# Patient Record
Sex: Female | Born: 1970 | Race: White | Hispanic: No | Marital: Married | Smoking: Never smoker
Health system: Southern US, Community
[De-identification: ages and names within clinical notes are randomized; demographics above are authoritative.]

## PROBLEM LIST (undated history)

## (undated) DIAGNOSIS — Z9289 Personal history of other medical treatment: Secondary | ICD-10-CM

## (undated) DIAGNOSIS — Z803 Family history of malignant neoplasm of breast: Secondary | ICD-10-CM

## (undated) DIAGNOSIS — G43909 Migraine, unspecified, not intractable, without status migrainosus: Secondary | ICD-10-CM

## (undated) DIAGNOSIS — R112 Nausea with vomiting, unspecified: Secondary | ICD-10-CM

## (undated) DIAGNOSIS — T753XXA Motion sickness, initial encounter: Secondary | ICD-10-CM

## (undated) DIAGNOSIS — R42 Dizziness and giddiness: Secondary | ICD-10-CM

## (undated) DIAGNOSIS — Z9889 Other specified postprocedural states: Secondary | ICD-10-CM

## (undated) DIAGNOSIS — D219 Benign neoplasm of connective and other soft tissue, unspecified: Secondary | ICD-10-CM

## (undated) HISTORY — DX: Migraine, unspecified, not intractable, without status migrainosus: G43.909

## (undated) HISTORY — PX: WISDOM TOOTH EXTRACTION: SHX21

## (undated) HISTORY — PX: BREAST BIOPSY: SHX20

## (undated) HISTORY — DX: Benign neoplasm of connective and other soft tissue, unspecified: D21.9

## (undated) HISTORY — DX: Family history of malignant neoplasm of breast: Z80.3

## (undated) HISTORY — PX: CLEFT LIP REPAIR: SUR1164

## (undated) HISTORY — DX: Personal history of other medical treatment: Z92.89

---

## 2002-01-06 ENCOUNTER — Ambulatory Visit (HOSPITAL_COMMUNITY): Admission: RE | Admit: 2002-01-06 | Discharge: 2002-01-06 | Payer: Self-pay | Admitting: Family Medicine

## 2002-01-06 ENCOUNTER — Encounter: Payer: Self-pay | Admitting: Family Medicine

## 2002-09-30 HISTORY — PX: BREAST SURGERY: SHX581

## 2004-08-01 ENCOUNTER — Ambulatory Visit: Payer: Self-pay | Admitting: General Surgery

## 2006-02-10 ENCOUNTER — Ambulatory Visit: Payer: Self-pay

## 2006-04-18 ENCOUNTER — Inpatient Hospital Stay: Payer: Self-pay | Admitting: Obstetrics and Gynecology

## 2006-11-11 ENCOUNTER — Ambulatory Visit: Payer: Self-pay

## 2008-02-21 ENCOUNTER — Other Ambulatory Visit: Payer: Self-pay

## 2008-02-21 ENCOUNTER — Emergency Department: Payer: Self-pay | Admitting: Emergency Medicine

## 2008-08-10 ENCOUNTER — Ambulatory Visit: Payer: Self-pay

## 2012-07-09 DIAGNOSIS — J309 Allergic rhinitis, unspecified: Secondary | ICD-10-CM | POA: Insufficient documentation

## 2013-05-24 ENCOUNTER — Ambulatory Visit: Payer: Self-pay

## 2014-01-10 DIAGNOSIS — M6740 Ganglion, unspecified site: Secondary | ICD-10-CM | POA: Insufficient documentation

## 2014-04-14 DIAGNOSIS — M2240 Chondromalacia patellae, unspecified knee: Secondary | ICD-10-CM | POA: Insufficient documentation

## 2014-05-30 ENCOUNTER — Ambulatory Visit: Payer: Self-pay

## 2015-10-04 DIAGNOSIS — Z9289 Personal history of other medical treatment: Secondary | ICD-10-CM

## 2015-10-04 HISTORY — DX: Personal history of other medical treatment: Z92.89

## 2016-12-16 DIAGNOSIS — R079 Chest pain, unspecified: Secondary | ICD-10-CM | POA: Insufficient documentation

## 2017-06-20 ENCOUNTER — Encounter: Payer: Self-pay | Admitting: Certified Nurse Midwife

## 2017-06-20 ENCOUNTER — Ambulatory Visit (INDEPENDENT_AMBULATORY_CARE_PROVIDER_SITE_OTHER): Payer: BLUE CROSS/BLUE SHIELD | Admitting: Certified Nurse Midwife

## 2017-06-20 VITALS — BP 120/80 | HR 76 | Ht 66.0 in | Wt 213.0 lb

## 2017-06-20 DIAGNOSIS — Z1211 Encounter for screening for malignant neoplasm of colon: Secondary | ICD-10-CM | POA: Diagnosis not present

## 2017-06-20 DIAGNOSIS — Z1239 Encounter for other screening for malignant neoplasm of breast: Secondary | ICD-10-CM

## 2017-06-20 DIAGNOSIS — Z1231 Encounter for screening mammogram for malignant neoplasm of breast: Secondary | ICD-10-CM

## 2017-06-20 DIAGNOSIS — Z01419 Encounter for gynecological examination (general) (routine) without abnormal findings: Secondary | ICD-10-CM | POA: Diagnosis not present

## 2017-06-20 DIAGNOSIS — Z124 Encounter for screening for malignant neoplasm of cervix: Secondary | ICD-10-CM

## 2017-06-20 NOTE — Progress Notes (Signed)
Gynecology Annual Exam  PCP: Sofie Hartigan, MD  Chief Complaint:  Chief Complaint  Patient presents with  . Gynecologic Exam    History of Present Illness:Mikayla Odom is a 46 year old Caucasian/White female, G4 P3013, who presents for her annual exam. She continues to have irregular menses. Had a workup for AUB last year including an ultrasound which revealed a couple of small intramural fibroids and an endometrial biopsy which showed proliferative endometrium. Her menses occur 22-90 days apart and last 5 days, with usually 2 heavier days requiring a super pad change 2-3 times a day and are with nickle sized clots. Some menses are lighter flow. Hematocrit this month was 39.4% She has had no intermenstrual spotting.  She denies dysmenorrhea. Has hot flashes off and on. The patient's past medical history is notable for a history of fibroids and a right breast biopsy for a fibroadenoma..  Since her last annual GYN exam dated 07/12/2015 (at [facility]), she has had a negative cardiac work up for chest pain. Her chest pain was MSK in etiology. Her daughter has recently married. She is sexually active. She is currently using condoms for contraception.  Her most recent pap smear was obtained ?06/22/2013 and was negative.  Her most recent mammogram obtained on 10/04/2015  revealed no significant changes.  There is a positive history of breast cancer in her cousin and paternal aunt. Genetic testing has not been done.  There is no family history of ovarian cancer.  The patient does do occ self breast exams.  The patient does not smoke.  The patient does drink a few glasses a week.  The patient does not use illegal drugs.  The patient exercises regularly.  The patient does get adequate calcium in her diet.  She had a recent cholesterol screen 05/2017 which was normal.    The patient denies current symptoms of depression.    Review of Systems: Review of Systems  Constitutional:  Negative for chills, fever and weight loss.  HENT: Negative for congestion, sinus pain and sore throat.   Eyes: Negative for blurred vision and pain.  Respiratory: Negative for hemoptysis, shortness of breath and wheezing.   Cardiovascular: Negative for chest pain, palpitations and leg swelling.  Gastrointestinal: Negative for abdominal pain, blood in stool, diarrhea, heartburn, nausea and vomiting.  Genitourinary: Negative for dysuria, frequency, hematuria and urgency.       Positive for irregular menses  Musculoskeletal: Negative for back pain, joint pain and myalgias.  Skin: Negative for itching and rash.  Neurological: Negative for dizziness, tingling and headaches.  Endo/Heme/Allergies: Negative for environmental allergies and polydipsia. Does not bruise/bleed easily.       Negative for hirsutism. POsitive for intermittent hot flashes   Psychiatric/Behavioral: Negative for depression. The patient is not nervous/anxious and does not have insomnia.     Past Medical History:  Past Medical History:  Diagnosis Date  . Breast mass 2006   benign  . Family history of breast cancer    paternal auntx2, paternal cousin x1, maternal cousin x1  . History of mammogram 10/04/2015   Birads 1  . Leiomyoma   . Migraine     Past Surgical History:  Past Surgical History:  Procedure Laterality Date  . BREAST SURGERY Right 2004   biopsy - fibroadenoma  . CLEFT LIP REPAIR    . WISDOM TOOTH EXTRACTION      Family History:  Family History  Problem Relation Age of Onset  .  Breast cancer Paternal Aunt 15  . Heart disease Maternal Grandfather   . Breast cancer Cousin 75  . Breast cancer Cousin 74    Social History:  Social History   Social History  . Marital status: Married    Spouse name: N/A  . Number of children: 3  . Years of education: 26   Occupational History  . Teacher    Social History Main Topics  . Smoking status: Never Smoker  . Smokeless tobacco: Never Used  .  Alcohol use 1.2 oz/week    2 Glasses of wine per week     Comment: occasionally  . Drug use: No  . Sexual activity: Yes    Partners: Male    Birth control/ protection: Condom   Other Topics Concern  . Not on file   Social History Narrative  . No narrative on file    Allergies:  Allergies  Allergen Reactions  . Penicillins Other (See Comments)    Medications: Current Outpatient Prescriptions:  .  cetirizine (ZYRTEC ALLERGY) 10 MG tablet, Take by mouth., Disp: , Rfl:  .  Cholecalciferol (VITAMIN D3) 1000 units CAPS, Take by mouth., Disp: , Rfl:  .  Magnesium 100 MG TABS, Take by mouth., Disp: , Rfl:  .  vitamin B-12 (CYANOCOBALAMIN) 1000 MCG tablet, Take by mouth., Disp: , Rfl:  Physical Exam Vitals: BP 120/80   Pulse 76   Ht '5\' 6"'$  (1.676 m)   Wt 213 lb (96.6 kg)   LMP 05/30/2017 (Exact Date)   BMI 34.38 kg/m   General: NAD HEENT: normocephalic, anicteric Neck: no thyroid enlargement, no palpable nodules, no cervical lymphadenopathy  Pulmonary: No increased work of breathing, CTAB Cardiovascular: RRR, without murmur  Breast: Breast symmetrical, no tenderness, no palpable nodules or masses, no skin or nipple retraction present, no nipple discharge.  No axillary, infraclavicular or supraclavicular lymphadenopathy. Abdomen: Soft, non-tender, non-distended.  Umbilicus without lesions.  No hepatomegaly or masses palpable. No evidence of hernia. Genitourinary:  External: Normal external female genitalia.  Normal urethral meatus, normal Bartholin's and Skene's glands.    Vagina: Normal vaginal mucosa, no evidence of prolapse.    Cervix: Grossly normal in appearance, no bleeding, non-tender  Uterus: Anteverted, normal size, shape, and consistency, mobile, and non-tender  Adnexa: No adnexal masses, non-tender  Rectal: deferred  Lymphatic: no evidence of inguinal lymphadenopathy Extremities: no edema, erythema, or tenderness Neurologic: Grossly intact Psychiatric: mood  appropriate, affect full     Assessment: 46 y.o. annual gyn exam Perimenopausal bleeding  Plan:   1) Breast cancer screening - recommend monthly self breast exam and annual screening mammograms. Mammogram was ordered today.  2) Colon cancer screening: FIT test ordered. Given home collection kit.  3) Cervical cancer screening - Pap was done  4) Contraception - condoms  5) Routine healthcare maintenance including cholesterol and diabetes screening managed by PCP   6) RTO in 1 year and prn   Dalia Heading, CNM

## 2017-06-22 ENCOUNTER — Encounter: Payer: Self-pay | Admitting: Certified Nurse Midwife

## 2017-06-22 DIAGNOSIS — G43909 Migraine, unspecified, not intractable, without status migrainosus: Secondary | ICD-10-CM | POA: Insufficient documentation

## 2017-06-23 LAB — IGP,RFX APTIMA HPV ALL PTH: PAP SMEAR COMMENT: 0

## 2017-07-07 ENCOUNTER — Ambulatory Visit
Admission: RE | Admit: 2017-07-07 | Discharge: 2017-07-07 | Disposition: A | Discharge status: Home / Self Care | Payer: BLUE CROSS/BLUE SHIELD | Source: Ambulatory Visit | Attending: Certified Nurse Midwife | Admitting: Certified Nurse Midwife

## 2017-07-07 DIAGNOSIS — Z1231 Encounter for screening mammogram for malignant neoplasm of breast: Secondary | ICD-10-CM | POA: Diagnosis present

## 2017-07-07 DIAGNOSIS — R928 Other abnormal and inconclusive findings on diagnostic imaging of breast: Secondary | ICD-10-CM | POA: Insufficient documentation

## 2017-07-07 DIAGNOSIS — Z1239 Encounter for other screening for malignant neoplasm of breast: Secondary | ICD-10-CM

## 2017-07-08 ENCOUNTER — Encounter: Payer: Self-pay | Admitting: Obstetrics and Gynecology

## 2017-07-09 ENCOUNTER — Other Ambulatory Visit: Payer: Self-pay | Admitting: *Deleted

## 2017-07-09 ENCOUNTER — Inpatient Hospital Stay
Admission: RE | Admit: 2017-07-09 | Discharge: 2017-07-09 | Disposition: A | Discharge status: Home / Self Care | Payer: Self-pay | Source: Ambulatory Visit | Attending: *Deleted | Admitting: *Deleted

## 2017-07-09 ENCOUNTER — Other Ambulatory Visit: Payer: Self-pay | Admitting: Certified Nurse Midwife

## 2017-07-09 DIAGNOSIS — R928 Other abnormal and inconclusive findings on diagnostic imaging of breast: Secondary | ICD-10-CM

## 2017-07-09 DIAGNOSIS — Z9289 Personal history of other medical treatment: Secondary | ICD-10-CM

## 2017-07-09 DIAGNOSIS — N6320 Unspecified lump in the left breast, unspecified quadrant: Secondary | ICD-10-CM

## 2017-07-10 ENCOUNTER — Encounter: Payer: Self-pay | Admitting: Certified Nurse Midwife

## 2017-07-15 ENCOUNTER — Ambulatory Visit
Admission: RE | Admit: 2017-07-15 | Discharge: 2017-07-15 | Disposition: A | Discharge status: Home / Self Care | Payer: BLUE CROSS/BLUE SHIELD | Source: Ambulatory Visit | Attending: Certified Nurse Midwife | Admitting: Certified Nurse Midwife

## 2017-07-15 DIAGNOSIS — R928 Other abnormal and inconclusive findings on diagnostic imaging of breast: Secondary | ICD-10-CM | POA: Diagnosis present

## 2017-07-15 DIAGNOSIS — N6320 Unspecified lump in the left breast, unspecified quadrant: Secondary | ICD-10-CM

## 2018-04-12 ENCOUNTER — Encounter: Payer: Self-pay | Admitting: Certified Nurse Midwife

## 2018-07-29 ENCOUNTER — Other Ambulatory Visit: Payer: Self-pay | Admitting: Certified Nurse Midwife

## 2018-07-29 DIAGNOSIS — Z1231 Encounter for screening mammogram for malignant neoplasm of breast: Secondary | ICD-10-CM

## 2018-08-05 ENCOUNTER — Ambulatory Visit
Admission: RE | Admit: 2018-08-05 | Discharge: 2018-08-05 | Disposition: A | Discharge status: Home / Self Care | Payer: BLUE CROSS/BLUE SHIELD | Source: Ambulatory Visit | Attending: Certified Nurse Midwife | Admitting: Certified Nurse Midwife

## 2018-08-05 DIAGNOSIS — Z1231 Encounter for screening mammogram for malignant neoplasm of breast: Secondary | ICD-10-CM | POA: Diagnosis present

## 2018-08-18 ENCOUNTER — Ambulatory Visit
Admission: EM | Admit: 2018-08-18 | Discharge: 2018-08-18 | Disposition: A | Discharge status: Home / Self Care | Payer: BLUE CROSS/BLUE SHIELD | Attending: Family Medicine | Admitting: Family Medicine

## 2018-08-18 ENCOUNTER — Other Ambulatory Visit: Payer: Self-pay

## 2018-08-18 ENCOUNTER — Encounter: Payer: Self-pay | Admitting: Emergency Medicine

## 2018-08-18 DIAGNOSIS — R0789 Other chest pain: Secondary | ICD-10-CM | POA: Insufficient documentation

## 2018-08-18 DIAGNOSIS — M7918 Myalgia, other site: Secondary | ICD-10-CM | POA: Diagnosis not present

## 2018-08-18 NOTE — ED Triage Notes (Signed)
Patient c/o chest pain and upper back pain off and on since Saturday.  Patient also reports an increase in hot flashes.  Patient denies chest pain at this time.  Patient denies SOB or difficulty breathing.  Patient denies N/V.

## 2018-08-18 NOTE — Discharge Instructions (Signed)
Ibuprofen as needed.  Rest.  Follow up with your PCP.  Take care  Dr. Lacinda Axon

## 2018-08-18 NOTE — ED Provider Notes (Signed)
[facility]   CSN: [id number] Arrival date & time: 08/18/18  1623  History   Chief Complaint Chief Complaint  Patient presents with  . Chest Pain   HPI  47 year old female presents with chest pain and upper back pain.  Patient states she is had intermittent pain since Saturday.  Comes and goes.  Located centrally.  Described as achy.  She is also has some sharp upper chest pains.  She is had ongoing hot flashes which she attributes to menopause.  No nausea, vomiting.  No shortness of breath.  No association with exertion.  Additionally, patient states that she has had upper back pain as well located between the scapula.  Thinks this may be exacerbated by computer work.  No other reported symptoms.  No known relieving factors.  No other complaints.  PMH, Surgical Hx, Family Hx, Social History reviewed and updated as below.  Past Medical History:  Diagnosis Date  . Family history of breast cancer    2/17, 10/18 cancer genetic testing letter sent  . History of mammogram 10/04/2015   Birads 1  . Leiomyoma   . Migraine    Patient Active Problem List   Diagnosis Date Noted  . Migraine    Past Surgical History:  Procedure Laterality Date  . BREAST BIOPSY Right   . BREAST SURGERY Right 2004   biopsy - fibroadenoma  . CLEFT LIP REPAIR    . WISDOM TOOTH EXTRACTION     OB History    Gravida  4   Para  3   Term  3   Preterm      AB  1   Living  3     SAB  1   TAB      Ectopic      Multiple      Live Births  3          Home Medications    Prior to Admission medications   Medication Sig Start Date End Date Taking? Authorizing Provider  cetirizine (ZYRTEC ALLERGY) 10 MG tablet Take by mouth.   Yes [provider]  Cholecalciferol (VITAMIN D3) 1000 units CAPS Take by mouth.   Yes [provider]  Magnesium 100 MG TABS Take by mouth.   Yes [provider]  vitamin B-12 (CYANOCOBALAMIN) 1000 MCG tablet Take by mouth.    Yes [provider]    Family History Family History  Problem Relation Age of Onset  . Breast cancer Paternal Aunt 65  . Heart disease Maternal Grandfather   . Breast cancer Cousin 55  . Breast cancer Cousin 63  . Breast cancer Paternal Aunt   . Healthy Mother   . Atrial fibrillation Father     Social History Social History   Tobacco Use  . Smoking status: Never Smoker  . Smokeless tobacco: Never Used  Substance Use Topics  . Alcohol use: Yes    Alcohol/week: 2.0 standard drinks    Types: 2 Glasses of wine per week    Comment: occasionally  . Drug use: No     Allergies   Penicillins   Review of Systems Review of Systems  Cardiovascular: Positive for chest pain.  Musculoskeletal: Positive for back pain.   Physical Exam Triage Vital Signs ED Triage Vitals  Enc Vitals Group     BP 08/18/18 1635 (!) 153/87     Pulse Rate 08/18/18 1635 78     Resp 08/18/18 1635 16     Temp 08/18/18  1635 98.8 F (37.1 C)     Temp Source 08/18/18 1635 Oral     SpO2 08/18/18 1635 99 %     Weight 08/18/18 1634 215 lb (97.5 kg)     Height 08/18/18 1634 5\' 6"  (1.676 m)     Head Circumference --      Peak Flow --      Pain Score 08/18/18 1634 0     Pain Loc --      Pain Edu? --      Excl. in [place]? --    Updated Vital Signs BP (!) 153/87 (BP Location: Left Arm)   Pulse 78   Temp 98.8 F (37.1 C) (Oral)   Resp 16   Ht 5\' 6"  (1.676 m)   Wt 97.5 kg   LMP 07/09/2018   SpO2 99%   BMI 34.70 kg/m   Visual Acuity Right Eye Distance:   Left Eye Distance:   Bilateral Distance:    Right Eye Near:   Left Eye Near:    Bilateral Near:     Physical Exam  Constitutional: She is oriented to person, place, and time. She appears well-developed. No distress.  HENT:  Head: Normocephalic and atraumatic.  Cardiovascular: Normal rate and regular rhythm.  Pulmonary/Chest: Effort normal and breath sounds normal. She has no wheezes. She has no rales. She exhibits tenderness.    Neurological: She is alert and oriented to person, place, and time.  Psychiatric: She has a normal mood and affect. Her behavior is normal.  Nursing note and vitals reviewed.  UC Treatments / Results  Labs (all labs ordered are listed, but only abnormal results are displayed) Labs Reviewed - No data to display  EKG Interpretation: Normal sinus rhythm at the rate of 79. Normal intervals. T wave inversion in V3.  Unremarkable EKG.   Radiology No results found.  Procedures Procedures (including critical care time)  Medications Ordered in UC Medications - No data to display  Initial Impression / Assessment and Plan / UC Course  I have reviewed the triage vital signs and the nursing notes.  Pertinent labs & imaging results that were available during my care of the patient were reviewed by me and considered in my medical decision making (see chart for details).    47 year old female presents with musculoskeletal chest pain.  No evidence of cardiac etiology.  EKG unremarkable.  Advised ibuprofen.  Follow up with PCP.  Final Clinical Impressions(s) / UC Diagnoses   Final diagnoses:  Musculoskeletal pain     Discharge Instructions     Ibuprofen as needed.  Rest.  Follow up with your PCP.  Take care  Dr. Lacinda Axon    ED Prescriptions    None     Controlled Substance Prescriptions [place] Controlled Substance Registry consulted? Not Applicable   Coral Spikes, DO 08/18/18 1803

## 2018-09-09 ENCOUNTER — Ambulatory Visit: Payer: BLUE CROSS/BLUE SHIELD | Admitting: Certified Nurse Midwife

## 2018-10-15 NOTE — Progress Notes (Signed)
Gynecology Annual Exam  PCP: Sofie Hartigan, MD  Chief Complaint:  Chief Complaint  Patient presents with  . Gynecologic Exam    Rt Ovarian cyst?    History of Present Illness:Mikayla Odom is a 48 year old Caucasian/White female, G4 P3013, who presents for her annual exam. She is having problems with RLQ cramping and pressure x 1-2 weeks. The cramping radiates into her right thigh. No dysuria.  She is having irregular and infrequent menses. LMP 07/09/2018. They occur every 1-4 months ( Dec 7, April 18, June 27, July 24, Sept 13, Oct 10) and last 3-4 days with a moderate flow. Her menses are not as heavy as they were. Had a workup for AUB 2017 including an ultrasound which revealed a couple of small intramural fibroids and an endometrial biopsy which showed proliferative endometrium.  She has had one episode of  intermenstrual spotting between the June and July menses. .  She denies dysmenorrhea. Her hot flashes have increased in intensity The patient's past medical history is notable for a history of fibroids, obesity (current BMI=36.33 kg/m2),  and a right breast biopsy for a fibroadenoma..  Since her last annual GYN exam dated 06/20/2017 she has no other significant changes in her health.  She is sexually active. She is currently using condoms for contraception.  Her most recent pap smear was obtained 06/20/2017 and was negative.  Her most recent mammogram obtained on 08/05/2018 was negative. There is a positive history of breast cancer in her cousin and paternal aunt. Genetic testing has not been done.  There is no family history of ovarian cancer.  The patient does do occ self breast exams.  The patient does not smoke.  The patient does drink 2 glasses of wine a week.  The patient does not use illegal drugs.  The patient exercises regularly.  The patient does get adequate calcium in her diet.  She had a recent cholesterol screen 05/2018 by her PCP which was  borderline   The patient denies current symptoms of depression.    Review of Systems: Review of Systems  Constitutional: Negative for chills, fever and weight loss.  HENT: Negative for congestion, sinus pain and sore throat.   Eyes: Negative for blurred vision and pain.  Respiratory: Negative for hemoptysis, shortness of breath and wheezing.   Cardiovascular: Negative for chest pain, palpitations and leg swelling.  Gastrointestinal: Positive for abdominal pain (cramping in RLQ radiating to thigh). Negative for blood in stool, diarrhea, heartburn, nausea and vomiting.  Genitourinary: Negative for dysuria, frequency, hematuria and urgency.       Positive for irregular menses  Musculoskeletal: Negative for back pain, joint pain and myalgias.  Skin: Negative for itching and rash.  Neurological: Negative for dizziness, tingling and headaches.  Endo/Heme/Allergies: Negative for environmental allergies and polydipsia. Does not bruise/bleed easily.       Negative for hirsutism. Positive for hot flashes   Psychiatric/Behavioral: Negative for depression. The patient is not nervous/anxious and does not have insomnia.     Past Medical History:  Past Medical History:  Diagnosis Date  . Family history of breast cancer    2/17, 10/18 cancer genetic testing letter sent  . History of mammogram 10/04/2015   Birads 1  . Leiomyoma   . Migraine     Past Surgical History:  Past Surgical History:  Procedure Laterality Date  . BREAST BIOPSY Right   . BREAST SURGERY Right 2004   biopsy - fibroadenoma  .  CLEFT LIP REPAIR    . WISDOM TOOTH EXTRACTION      Family History:  Family History  Problem Relation Age of Onset  . Breast cancer Paternal Aunt 52  . Heart disease Maternal Grandfather   . Breast cancer Cousin 84  . Breast cancer Cousin 61  . Breast cancer Paternal Aunt   . Healthy Mother   . Atrial fibrillation Father   . Colon cancer Maternal Aunt 64       MGA    Social History:   Social History   Socioeconomic History  . Marital status: Married    Spouse name: Not on file  . Number of children: 3  . Years of education: 57  . Highest education level: Not on file  Occupational History  . Occupation: Pharmacist, hospital  Social Needs  . Financial resource strain: Not on file  . Food insecurity:    Worry: Not on file    Inability: Not on file  . Transportation needs:    Medical: Not on file    Non-medical: Not on file  Tobacco Use  . Smoking status: Never Smoker  . Smokeless tobacco: Never Used  Substance and Sexual Activity  . Alcohol use: Yes    Alcohol/week: 2.0 standard drinks    Types: 2 Glasses of wine per week    Comment: occasionally  . Drug use: No  . Sexual activity: Yes    Partners: Male    Birth control/protection: Condom  Lifestyle  . Physical activity:    Days per week: 3 days    Minutes per session: 40 min  . Stress: Not on file  Relationships  . Social connections:    Talks on phone: Not on file    Gets together: Not on file    Attends religious service: Not on file    Active member of club or organization: Not on file    Attends meetings of clubs or organizations: Not on file    Relationship status: Not on file  . Intimate partner violence:    Fear of current or ex partner: Not on file    Emotionally abused: Not on file    Physically abused: Not on file    Forced sexual activity: Not on file  Other Topics Concern  . Not on file  Social History Narrative  . Not on file    Allergies:  Allergies  Allergen Reactions  . Penicillins Other (See Comments)    Medications: Current Outpatient Medications:  .  cetirizine (ZYRTEC ALLERGY) 10 MG tablet, Take by mouth., Disp: , Rfl:  .  Cholecalciferol (VITAMIN D3) 1000 units CAPS, Take by mouth., Disp: , Rfl:  .  Magnesium 100 MG TABS, Take by mouth., Disp: , Rfl:  .  vitamin B-12 (CYANOCOBALAMIN) 1000 MCG tablet, Take by mouth., Disp: , Rfl:  Physical Exam Vitals: BP 130/78 (BP  Location: Right Arm, Patient Position: Sitting, Cuff Size: Normal)   Pulse 89   Ht 5\' 6"  (1.676 m)   Wt 225 lb (102.1 kg)   LMP 07/09/2018   BMI 36.32 kg/m   General: pleasant WF in  NAD HEENT: normocephalic, anicteric Neck: no thyroid enlargement, no palpable nodules, no cervical lymphadenopathy  Pulmonary: No increased work of breathing, CTAB Cardiovascular: RRR, without murmur  Breast: Breast symmetrical, no tenderness, no palpable nodules or masses, no skin or nipple retraction present, no nipple discharge.  No axillary, infraclavicular or supraclavicular lymphadenopathy. Abdomen: Soft, non-tender, non-distended.  Umbilicus without lesions.  No hepatomegaly or  masses palpable. No evidence of hernia. Genitourinary:  External: Normal external female genitalia.  Normal urethral meatus, normal Bartholin's and Skene's glands.    Vagina: Normal vaginal mucosa, no evidence of prolapse.    Cervix: Grossly normal in appearance, no bleeding, non-tender  Uterus: Anteverted, normal size, shape, and consistency, mobile, and non-tender  Adnexa: No adnexal masses, non-tender  Rectal: deferred  Lymphatic: no evidence of inguinal lymphadenopathy Extremities: no edema, erythema, or tenderness Neurologic: Grossly intact Psychiatric: mood appropriate, affect full     Assessment: 48 y.o. annual gyn exam Perimenopausal bleeding and vasomotor symptoms RLQ cramping and pressure  Plan:   1) Breast cancer screening - recommend monthly self breast exam and continued annual screening mammograms. Mammogram is UTD. Next due after 08/06/2019  2) Colon cancer screening: Will probably have colonoscopy. Will have done through PCP  3) Cervical cancer screening - Pap was done  4) Contraception - condoms  5) Routine healthcare maintenance including cholesterol and diabetes screening managed by PCP   6) RLQ cramping and pain: Pelvic ultrasound and follow up with me in the next week or two   Dalia Heading, CNM

## 2018-10-16 ENCOUNTER — Other Ambulatory Visit: Payer: Self-pay

## 2018-10-16 ENCOUNTER — Encounter: Payer: Self-pay | Admitting: Certified Nurse Midwife

## 2018-10-16 ENCOUNTER — Ambulatory Visit (INDEPENDENT_AMBULATORY_CARE_PROVIDER_SITE_OTHER): Payer: BLUE CROSS/BLUE SHIELD | Admitting: Certified Nurse Midwife

## 2018-10-16 ENCOUNTER — Other Ambulatory Visit (HOSPITAL_COMMUNITY)
Admission: RE | Admit: 2018-10-16 | Discharge: 2018-10-16 | Disposition: A | Discharge status: Home / Self Care | Payer: BLUE CROSS/BLUE SHIELD | Source: Ambulatory Visit | Attending: Certified Nurse Midwife | Admitting: Certified Nurse Midwife

## 2018-10-16 VITALS — BP 130/78 | HR 89 | Ht 66.0 in | Wt 225.0 lb

## 2018-10-16 DIAGNOSIS — Z124 Encounter for screening for malignant neoplasm of cervix: Secondary | ICD-10-CM | POA: Diagnosis not present

## 2018-10-16 DIAGNOSIS — Z01419 Encounter for gynecological examination (general) (routine) without abnormal findings: Secondary | ICD-10-CM

## 2018-10-16 DIAGNOSIS — N951 Menopausal and female climacteric states: Secondary | ICD-10-CM | POA: Diagnosis not present

## 2018-10-16 DIAGNOSIS — R1031 Right lower quadrant pain: Secondary | ICD-10-CM | POA: Diagnosis not present

## 2018-10-21 LAB — CYTOLOGY - PAP
Diagnosis: NEGATIVE
HPV (WINDOPATH): NOT DETECTED

## 2018-10-22 ENCOUNTER — Encounter: Payer: Self-pay | Admitting: Obstetrics and Gynecology

## 2018-10-30 ENCOUNTER — Ambulatory Visit (INDEPENDENT_AMBULATORY_CARE_PROVIDER_SITE_OTHER): Payer: BLUE CROSS/BLUE SHIELD

## 2018-10-30 ENCOUNTER — Encounter: Payer: Self-pay | Admitting: Certified Nurse Midwife

## 2018-10-30 ENCOUNTER — Ambulatory Visit (INDEPENDENT_AMBULATORY_CARE_PROVIDER_SITE_OTHER): Payer: BLUE CROSS/BLUE SHIELD | Admitting: Certified Nurse Midwife

## 2018-10-30 VITALS — BP 122/74 | Ht 66.0 in | Wt 220.0 lb

## 2018-10-30 DIAGNOSIS — R1031 Right lower quadrant pain: Secondary | ICD-10-CM

## 2018-10-30 DIAGNOSIS — D252 Subserosal leiomyoma of uterus: Secondary | ICD-10-CM | POA: Diagnosis not present

## 2018-10-30 NOTE — Progress Notes (Signed)
  Mikayla Odom is a 48 year old Caucasian/White female, G4 P3013, who presented for her annual exam 10/16/18 with complaints of RLQ cramping and pressure x 1-2 weeks. The cramping radiated into her right thigh. She reports that on the 19th of January, the pain worsened briefly, then resolved. Has not had a menses since October 2019. Menses have been irregular, every 1-4 months apart in the past year.   Ultrasound demonstrates the following: The uterus is anteverted and measures 10.7 x 6.8 x 6.2cm. Echo texture is heterogenous with evidence of focal mass. Within the uterus is one suspected fibroid measuring: Fibroid 1:  1.7 x 1.3 x 1.7cm (RT/anterior, SS) (there were 3 small fibroids on the 2017 ultrasound)  The Endometrium measures 13.68mm.  Right Ovary measures 3.2 x 1.8 x 2.0cm with dominant follicle seen.  Left Ovary is not seen.  Survey of the adnexa demonstrates no adnexal masses. There is no free fluid in the cul de sac.   PMHx: She  has a past medical history of Family history of breast cancer, History of mammogram (10/04/2015), Leiomyoma, and Migraine. Also,  has a past surgical history that includes Cleft lip repair; Breast surgery (Right, 2004); Wisdom tooth extraction; and Breast biopsy (Right)., family history includes Atrial fibrillation in her father; Breast cancer (age of onset: 32) in her cousin and paternal aunt; Breast cancer (age of onset: 53) in her cousin; Breast cancer (age of onset: 73) in her paternal aunt; Colon cancer (age of onset: 24) in her maternal aunt; Healthy in her mother; Heart disease in her maternal grandfather.,  reports that she has never smoked. She has never used smokeless tobacco. She reports current alcohol use of about 2.0 standard drinks of alcohol per week. She reports that she does not use drugs.  She has a current medication list which includes the following prescription(s): cetirizine, vitamin d3, magnesium, and vitamin b-12. Also, is  allergic to penicillins.  ROS  Objective: BP 122/74   Ht 5\' 6"  (1.676 m)   Wt 220 lb (99.8 kg)   BMI 35.51 kg/m   Physical examination Constitutional NAD, Conversant        Neuro: Grossly intact  Psych: Oriented to PPT.  Normal mood. Normal affect.   Assessment:  RLQ pain and cramping-now resolved. Ultrasound does not demonstrate reason for pain and cramping.  Plan: Mikayla Odom will return for her annual exam and prn  Dalia Heading, CNM

## 2019-06-14 ENCOUNTER — Other Ambulatory Visit: Payer: Self-pay | Admitting: Family Medicine

## 2019-06-14 DIAGNOSIS — Z1231 Encounter for screening mammogram for malignant neoplasm of breast: Secondary | ICD-10-CM

## 2019-07-22 ENCOUNTER — Encounter: Payer: Self-pay | Admitting: *Deleted

## 2019-07-22 ENCOUNTER — Telehealth: Payer: Self-pay | Admitting: Gastroenterology

## 2019-07-22 NOTE — Telephone Encounter (Signed)
Gastroenterology Pre-Procedure Review  Request Date: Raynelle Dick 10/07/19  [place] Requesting Physician: Dr. Allen Norris   PATIENT REVIEW QUESTIONS: The patient responded to the following health history questions as indicated:    1. Are you having any GI issues? no 2. Do you have a personal history of Polyps? no 3. Do you have a family history of Colon Cancer or Polyps? yes (Mother w/ polyps) 4. Diabetes Mellitus? no 5. Joint replacements in the past 12 months?no 6. Major health problems in the past 3 months?no 7. Any artificial heart valves, MVP, or defibrillator?no BMI: 35.51        Pulmonary disease? No    MEDICATIONS & ALLERGIES:    Patient reports the following regarding taking any anticoagulation/antiplatelet therapy:   Plavix, Coumadin, Eliquis, Xarelto, Lovenox, Pradaxa, Brilinta, or Effient? no Aspirin? no  Patient confirms/reports the following medications:  Current Outpatient Medications  Medication Sig Dispense Refill  . cetirizine (ZYRTEC ALLERGY) 10 MG tablet Take by mouth.    . Cholecalciferol (VITAMIN D3) 1000 units CAPS Take by mouth.    . Magnesium 100 MG TABS Take by mouth.    . vitamin B-12 (CYANOCOBALAMIN) 1000 MCG tablet Take by mouth.     No current facility-administered medications for this visit.     Patient confirms/reports the following allergies:  Allergies  Allergen Reactions  . Penicillins Other (See Comments)    No orders of the defined types were placed in this encounter.   AUTHORIZATION INFORMATION Primary Insurance: 1D#: Group #:  Secondary Insurance: 1D#: Group #:  SCHEDULE INFORMATION: Date:  Time: Location:

## 2019-07-26 ENCOUNTER — Other Ambulatory Visit: Payer: Self-pay

## 2019-07-26 DIAGNOSIS — Z1211 Encounter for screening for malignant neoplasm of colon: Secondary | ICD-10-CM

## 2019-08-09 ENCOUNTER — Ambulatory Visit
Admission: RE | Admit: 2019-08-09 | Discharge: 2019-08-09 | Disposition: A | Discharge status: Home / Self Care | Payer: BC Managed Care – PPO | Source: Ambulatory Visit | Attending: Family Medicine | Admitting: Family Medicine

## 2019-08-09 ENCOUNTER — Other Ambulatory Visit: Payer: Self-pay

## 2019-08-09 DIAGNOSIS — Z1231 Encounter for screening mammogram for malignant neoplasm of breast: Secondary | ICD-10-CM | POA: Insufficient documentation

## 2019-09-17 ENCOUNTER — Telehealth: Payer: Self-pay

## 2019-09-17 NOTE — Telephone Encounter (Signed)
Returned patients call to R/S 10/11/19 colonoscopy with Dr. Allen Norris.  LVM for her to call me back-she wants to be rescheduled to sometime in March.  Thanks [employer]

## 2019-10-11 ENCOUNTER — Encounter: Admission: RE | Payer: Self-pay | Source: Home / Self Care

## 2019-10-11 ENCOUNTER — Ambulatory Visit
Admission: RE | Admit: 2019-10-11 | Payer: BC Managed Care – PPO | Source: Home / Self Care | Admitting: Gastroenterology

## 2019-10-11 SURGERY — COLONOSCOPY WITH PROPOFOL
Anesthesia: Choice

## 2020-01-14 ENCOUNTER — Telehealth: Payer: Self-pay | Admitting: Gastroenterology

## 2020-01-14 NOTE — Telephone Encounter (Signed)
Patient called & would like to r/s colonoscopy anytime after May 7,2021.Plese call her c#.

## 2020-01-18 ENCOUNTER — Telehealth: Payer: Self-pay

## 2020-01-18 ENCOUNTER — Other Ambulatory Visit: Payer: Self-pay

## 2020-01-18 DIAGNOSIS — Z1211 Encounter for screening for malignant neoplasm of colon: Secondary | ICD-10-CM

## 2020-01-18 NOTE — Telephone Encounter (Signed)
Patients call has been returned.  LVM for her to call me back to reschedule in May with Dr. Allen Norris at [facility].  I also sent a message via mychart.  Thank you,  Sharyn Lull, CMA

## 2020-02-09 ENCOUNTER — Other Ambulatory Visit: Payer: Self-pay

## 2020-02-09 ENCOUNTER — Encounter: Payer: Self-pay | Admitting: Gastroenterology

## 2020-02-10 ENCOUNTER — Encounter: Payer: Self-pay | Admitting: Gastroenterology

## 2020-02-16 ENCOUNTER — Other Ambulatory Visit
Admission: RE | Admit: 2020-02-16 | Discharge: 2020-02-16 | Disposition: A | Discharge status: Home / Self Care | Payer: BC Managed Care – PPO | Source: Ambulatory Visit | Attending: Gastroenterology | Admitting: Gastroenterology

## 2020-02-16 ENCOUNTER — Other Ambulatory Visit: Payer: Self-pay

## 2020-02-16 DIAGNOSIS — Z01812 Encounter for preprocedural laboratory examination: Secondary | ICD-10-CM | POA: Insufficient documentation

## 2020-02-16 DIAGNOSIS — Z20822 Contact with and (suspected) exposure to covid-19: Secondary | ICD-10-CM | POA: Insufficient documentation

## 2020-02-17 LAB — SARS CORONAVIRUS 2 (TAT 6-24 HRS): SARS Coronavirus 2: NEGATIVE

## 2020-02-17 NOTE — Discharge Instructions (Signed)
General Anesthesia, Adult, Care After This sheet gives you information about how to care for yourself after your procedure. Your health care provider may also give you more specific instructions. If you have problems or questions, contact your health care provider. What can I expect after the procedure? After the procedure, the following side effects are common:  Pain or discomfort at the IV site.  Nausea.  Vomiting.  Sore throat.  Trouble concentrating.  Feeling cold or chills.  Weak or tired.  Sleepiness and fatigue.  Soreness and body aches. These side effects can affect parts of the body that were not involved in surgery. Follow these instructions at home:  For at least 24 hours after the procedure:  Have a responsible adult stay with you. It is important to have someone help care for you until you are awake and alert.  Rest as needed.  Do not: ? Participate in activities in which you could fall or become injured. ? Drive. ? Use heavy machinery. ? Drink alcohol. ? Take sleeping pills or medicines that cause drowsiness. ? Make important decisions or sign legal documents. ? Take care of children on your own. Eating and drinking  Follow any instructions from your health care provider about eating or drinking restrictions.  When you feel hungry, start by eating small amounts of foods that are soft and easy to digest (bland), such as toast. Gradually return to your regular diet.  Drink enough fluid to keep your urine pale yellow.  If you vomit, rehydrate by drinking water, juice, or clear broth. General instructions  If you have sleep apnea, surgery and certain medicines can increase your risk for breathing problems. Follow instructions from your health care provider about wearing your sleep device: ? Anytime you are sleeping, including during daytime naps. ? While taking prescription pain medicines, sleeping medicines, or medicines that make you drowsy.  Return to  your normal activities as told by your health care provider. Ask your health care provider what activities are safe for you.  Take over-the-counter and prescription medicines only as told by your health care provider.  If you smoke, do not smoke without supervision.  Keep all follow-up visits as told by your health care provider. This is important. Contact a health care provider if:  You have nausea or vomiting that does not get better with medicine.  You cannot eat or drink without vomiting.  You have pain that does not get better with medicine.  You are unable to pass urine.  You develop a skin rash.  You have a fever.  You have redness around your IV site that gets worse. Get help right away if:  You have difficulty breathing.  You have chest pain.  You have blood in your urine or stool, or you vomit blood. Summary  After the procedure, it is common to have a sore throat or nausea. It is also common to feel tired.  Have a responsible adult stay with you for the first 24 hours after general anesthesia. It is important to have someone help care for you until you are awake and alert.  When you feel hungry, start by eating small amounts of foods that are soft and easy to digest (bland), such as toast. Gradually return to your regular diet.  Drink enough fluid to keep your urine pale yellow.  Return to your normal activities as told by your health care provider. Ask your health care provider what activities are safe for you. This information is not   intended to replace advice given to you by your health care provider. Make sure you discuss any questions you have with your health care provider. Document Revised: 09/19/2017 Document Reviewed: 05/02/2017 Elsevier Patient Education  2020 Elsevier Inc.  

## 2020-02-18 ENCOUNTER — Ambulatory Visit: Payer: BC Managed Care – PPO | Admitting: Anesthesiology

## 2020-02-18 ENCOUNTER — Other Ambulatory Visit: Payer: Self-pay

## 2020-02-18 ENCOUNTER — Encounter
Admission: RE | Disposition: A | Discharge status: Home / Self Care | Payer: Self-pay | Source: Home / Self Care | Attending: Gastroenterology

## 2020-02-18 ENCOUNTER — Encounter: Payer: Self-pay | Admitting: Gastroenterology

## 2020-02-18 ENCOUNTER — Ambulatory Visit
Admission: RE | Admit: 2020-02-18 | Discharge: 2020-02-18 | Disposition: A | Discharge status: Home / Self Care | Payer: BC Managed Care – PPO | Attending: Gastroenterology | Admitting: Gastroenterology

## 2020-02-18 DIAGNOSIS — Z86018 Personal history of other benign neoplasm: Secondary | ICD-10-CM | POA: Diagnosis not present

## 2020-02-18 DIAGNOSIS — Z79899 Other long term (current) drug therapy: Secondary | ICD-10-CM | POA: Diagnosis not present

## 2020-02-18 DIAGNOSIS — Z1211 Encounter for screening for malignant neoplasm of colon: Secondary | ICD-10-CM | POA: Diagnosis present

## 2020-02-18 DIAGNOSIS — Z6835 Body mass index (BMI) 35.0-35.9, adult: Secondary | ICD-10-CM | POA: Insufficient documentation

## 2020-02-18 DIAGNOSIS — K635 Polyp of colon: Secondary | ICD-10-CM

## 2020-02-18 DIAGNOSIS — K640 First degree hemorrhoids: Secondary | ICD-10-CM | POA: Insufficient documentation

## 2020-02-18 DIAGNOSIS — Z8249 Family history of ischemic heart disease and other diseases of the circulatory system: Secondary | ICD-10-CM | POA: Diagnosis not present

## 2020-02-18 DIAGNOSIS — Z880 Allergy status to penicillin: Secondary | ICD-10-CM | POA: Insufficient documentation

## 2020-02-18 DIAGNOSIS — Z803 Family history of malignant neoplasm of breast: Secondary | ICD-10-CM | POA: Diagnosis not present

## 2020-02-18 DIAGNOSIS — K219 Gastro-esophageal reflux disease without esophagitis: Secondary | ICD-10-CM | POA: Diagnosis not present

## 2020-02-18 DIAGNOSIS — D122 Benign neoplasm of ascending colon: Secondary | ICD-10-CM | POA: Diagnosis not present

## 2020-02-18 DIAGNOSIS — R42 Dizziness and giddiness: Secondary | ICD-10-CM | POA: Diagnosis not present

## 2020-02-18 DIAGNOSIS — G43909 Migraine, unspecified, not intractable, without status migrainosus: Secondary | ICD-10-CM | POA: Diagnosis not present

## 2020-02-18 HISTORY — DX: Other specified postprocedural states: Z98.890

## 2020-02-18 HISTORY — DX: Motion sickness, initial encounter: T75.3XXA

## 2020-02-18 HISTORY — DX: Dizziness and giddiness: R42

## 2020-02-18 HISTORY — PX: COLONOSCOPY WITH PROPOFOL: SHX5780

## 2020-02-18 HISTORY — PX: POLYPECTOMY: SHX5525

## 2020-02-18 HISTORY — DX: Other specified postprocedural states: R11.2

## 2020-02-18 LAB — POCT PREGNANCY, URINE: Preg Test, Ur: NEGATIVE

## 2020-02-18 SURGERY — COLONOSCOPY WITH PROPOFOL
Anesthesia: General | Site: Rectum

## 2020-02-18 MED ORDER — LACTATED RINGERS IV SOLN: INTRAVENOUS | Status: DC

## 2020-02-18 MED ORDER — OXYCODONE HCL 5 MG/5ML PO SOLN: 5.0000 mg | Freq: Once | ORAL | Status: DC | PRN

## 2020-02-18 MED ORDER — OXYCODONE HCL 5 MG PO TABS: 5.0000 mg | ORAL_TABLET | Freq: Once | ORAL | Status: DC | PRN

## 2020-02-18 MED ORDER — STERILE WATER FOR IRRIGATION IR SOLN
Status: DC | PRN
  Administered 2020-02-18: 50 mL

## 2020-02-18 MED ORDER — LIDOCAINE HCL (CARDIAC) PF 100 MG/5ML IV SOSY
PREFILLED_SYRINGE | INTRAVENOUS | Status: DC | PRN
  Administered 2020-02-18: 40 mg via INTRAVENOUS

## 2020-02-18 MED ORDER — PROPOFOL 10 MG/ML IV BOLUS
INTRAVENOUS | Status: DC | PRN
  Administered 2020-02-18 (×7): 20 mg via INTRAVENOUS
  Administered 2020-02-18: 100 mg via INTRAVENOUS

## 2020-02-18 SURGICAL SUPPLY — 7 items
GOWN CVR UNV OPN BCK APRN NK (MISCELLANEOUS) ×2 IMPLANT
GOWN ISOL THUMB LOOP REG UNIV (MISCELLANEOUS) ×4
KIT ENDO PROCEDURE OLY (KITS) ×2 IMPLANT
MANIFOLD NEPTUNE II (INSTRUMENTS) ×2 IMPLANT
SNARE SHORT THROW 13M SML OVAL (MISCELLANEOUS) ×2 IMPLANT
TRAP ETRAP POLY (MISCELLANEOUS) ×2 IMPLANT
WATER STERILE IRR 250ML POUR (IV SOLUTION) ×2 IMPLANT

## 2020-02-18 NOTE — Anesthesia Procedure Notes (Signed)
Procedure Name: MAC Date/Time: 02/18/2020 8:49 AM Performed by: Vanetta Shawl, CRNA Pre-anesthesia Checklist: Patient identified, Emergency Drugs available, Suction available, Timeout performed and Patient being monitored Patient Re-evaluated:Patient Re-evaluated prior to induction Oxygen Delivery Method: Nasal cannula Placement Confirmation: positive ETCO2

## 2020-02-18 NOTE — Op Note (Signed)
[facility] Gastroenterology Patient Name: Mikayla Odom Procedure Date: 02/18/2020 8:34 AM MRN: [id number] Account #: [id number] Date of Birth: December 07, 1970 Admit Type: Outpatient Age: 49 Room: [facility] OR ROOM 01 Gender: Female Note Status: Finalized Procedure:             Colonoscopy Indications:           Screening for colorectal malignant neoplasm Providers:             [name] MD, MD Referring MD:          Sofie Hartigan (Referring MD) Medicines:             Propofol per Anesthesia Complications:         No immediate complications. Procedure:             Pre-Anesthesia Assessment:                        - Prior to the procedure, a History and Physical was                         performed, and patient medications and allergies were                         reviewed. The patient's tolerance of previous                         anesthesia was also reviewed. The risks and benefits                         of the procedure and the sedation options and risks                         were discussed with the patient. All questions were                         answered, and informed consent was obtained. Prior                         Anticoagulants: The patient has taken no previous                         anticoagulant or antiplatelet agents. ASA Grade                         Assessment: II - A patient with mild systemic disease.                         After reviewing the risks and benefits, the patient                         was deemed in satisfactory condition to undergo the                         procedure.                        After obtaining informed consent, the colonoscope was  passed under direct vision. Throughout the procedure,                         the patient's blood pressure, pulse, and oxygen                         saturations were monitored continuously. The                         Colonoscope was introduced through the  anus and                         advanced to the the cecum, identified by appendiceal                         orifice and ileocecal valve. The colonoscopy was                         performed without difficulty. The patient tolerated                         the procedure well. The quality of the bowel                         preparation was excellent. Findings:      The perianal and digital rectal examinations were normal.      A 5 mm polyp was found in the ascending colon. The polyp was sessile.       The polyp was removed with a cold snare. Resection and retrieval were       complete.      Non-bleeding internal hemorrhoids were found during retroflexion. The       hemorrhoids were Grade I (internal hemorrhoids that do not prolapse). Impression:            - One 5 mm polyp in the ascending colon, removed with                         a cold snare. Resected and retrieved.                        - Non-bleeding internal hemorrhoids. Recommendation:        - Discharge patient to home.                        - Resume previous diet.                        - Continue present medications.                        - Await pathology results.                        - Repeat colonoscopy in 5 years if polyp adenoma and                         10 years if hyperplastic Procedure Code(s):     --- Professional ---  45385, Colonoscopy, flexible; with removal of                         tumor(s), polyp(s), or other lesion(s) by snare                         technique Diagnosis Code(s):     --- Professional ---                        Z12.11, Encounter for screening for malignant neoplasm                         of colon                        K63.5, Polyp of colon CPT copyright 2019 American Medical Association. All rights reserved. The codes documented in this report are preliminary and upon coder review may  be revised to meet current compliance requirements. [name] MD,  MD 02/18/2020 9:06:16 AM This report has been signed electronically. Number of Addenda: 0 Note Initiated On: 02/18/2020 8:34 AM Scope Withdrawal Time: 0 hours 7 minutes 52 seconds  Total Procedure Duration: 0 hours 12 minutes 51 seconds  Estimated Blood Loss:  Estimated blood loss: none.      [facility]

## 2020-02-18 NOTE — Anesthesia Preprocedure Evaluation (Signed)
Anesthesia Evaluation  Patient identified by MRN, date of birth, ID band Patient awake    Reviewed: NPO status   History of Anesthesia Complications (+) PONV and history of anesthetic complications  Airway Mallampati: II  TM Distance: >3 FB Neck ROM: full    Dental no notable dental hx.    Pulmonary neg pulmonary ROS,    Pulmonary exam normal        Cardiovascular Exercise Tolerance: Good negative cardio ROS Normal cardiovascular exam  echo: 2018: NORMAL LEFT VENTRICULAR SYSTOLIC FUNCTION WITH MILD LVH  NORMAL LA PRESSURES WITH NORMAL DIASTOLIC FUNCTION  NORMAL RIGHT VENTRICULAR SYSTOLIC FUNCTION  NO VALVULAR REGURGITATION  NO VALVULAR STENOSIS  NO PRIOR STUDY FOR COMPARISON;      Neuro/Psych  Headaches, negative psych ROS   GI/Hepatic Neg liver ROS, GERD  Controlled,  Endo/Other  Morbid obesity (bmi 36)  Renal/GU negative Renal ROS  negative genitourinary   Musculoskeletal   Abdominal   Peds  Hematology negative hematology ROS (+)   Anesthesia Other Findings SARS Coronavirus  NEGATIVE     Reproductive/Obstetrics negative OB ROS                             Anesthesia Physical Anesthesia Plan  ASA: II  Anesthesia Plan: General   Post-op Pain Management:    Induction:   PONV Risk Score and Plan: 4 or greater and TIVA, Propofol infusion, Ondansetron and Treatment may vary due to age or medical condition  Airway Management Planned:   Additional Equipment:   Intra-op Plan:   Post-operative Plan:   Informed Consent: I have reviewed the patients History and Physical, chart, labs and discussed the procedure including the risks, benefits and alternatives for the proposed anesthesia with the patient or authorized representative who has indicated his/her understanding and acceptance.       Plan Discussed with: CRNA  Anesthesia Plan Comments:          Anesthesia Quick Evaluation

## 2020-02-18 NOTE — Anesthesia Postprocedure Evaluation (Signed)
Anesthesia Post Note  Patient: Mikayla Odom  Procedure(s) Performed: COLONOSCOPY WITH BIOPSY (N/A Rectum) POLYPECTOMY (N/A Rectum)     Patient location during evaluation: PACU Anesthesia Type: General Level of consciousness: awake and alert Pain management: pain level controlled Vital Signs Assessment: post-procedure vital signs reviewed and stable Respiratory status: spontaneous breathing, nonlabored ventilation, respiratory function stable and patient connected to nasal cannula oxygen Cardiovascular status: blood pressure returned to baseline and stable Postop Assessment: no apparent nausea or vomiting Anesthetic complications: no    Kwabena Strutz

## 2020-02-18 NOTE — Transfer of Care (Signed)
Immediate Anesthesia Transfer of Care Note  Patient: Mikayla Odom  Procedure(s) Performed: COLONOSCOPY WITH BIOPSY (N/A Rectum) POLYPECTOMY (N/A Rectum)  Patient Location: PACU  Anesthesia Type: General  Level of Consciousness: awake, alert  and patient cooperative  Airway and Oxygen Therapy: Patient Spontanous Breathing and Patient connected to supplemental oxygen  Post-op Assessment: Post-op Vital signs reviewed, Patient's Cardiovascular Status Stable, Respiratory Function Stable, Patent Airway and No signs of Nausea or vomiting  Post-op Vital Signs: Reviewed and stable  Complications: No apparent anesthesia complications

## 2020-02-18 NOTE — H&P (Signed)
Mikayla Lame, MD [address] Mikayla Odom, [place] [address] Phone: [phone] Fax : [phone]  Primary Care Physician:  Sofie Hartigan, MD Primary Gastroenterologist:  Dr. Allen Norris  Pre-Procedure History & Physical: HPI:  Mikayla Odom is a 49 y.o. female is here for a screening colonoscopy.   Past Medical History:  Diagnosis Date  . Family history of breast cancer    2/17, 10/18 cancer genetic testing letter sent  . History of mammogram 10/04/2015   Birads 1  . Leiomyoma   . Migraine    hormonal linked  . Motion sickness   . PONV (postoperative nausea and vomiting)   . Vertigo     Past Surgical History:  Procedure Laterality Date  . BREAST BIOPSY Right   . BREAST SURGERY Right 2004   biopsy - fibroadenoma  . CLEFT LIP REPAIR    . WISDOM TOOTH EXTRACTION      Prior to Admission medications   Medication Sig Start Date End Date Taking? Authorizing Provider  cetirizine (ZYRTEC ALLERGY) 10 MG tablet Take by mouth.   Yes [provider]  Cholecalciferol (VITAMIN D3) 1000 units CAPS Take by mouth.   Yes [provider]  Magnesium 100 MG TABS Take by mouth.   Yes [provider]  vitamin B-12 (CYANOCOBALAMIN) 1000 MCG tablet Take by mouth.   Yes [provider]    Allergies as of 01/18/2020 - Review Complete 10/30/2018  Allergen Reaction Noted  . Penicillins Other (See Comments) 06/04/2013    Family History  Problem Relation Age of Onset  . Breast cancer Paternal Aunt 62  . Heart disease Maternal Grandfather   . Breast cancer Cousin 32  . Breast cancer Cousin 62  . Breast cancer Paternal Aunt 22  . Healthy Mother   . Atrial fibrillation Father   . Colon cancer Maternal Aunt 50       MGA    Social History   Socioeconomic History  . Marital status: Married    Spouse name: Not on file  . Number of children: 3  . Years of education: 24  . Highest education level: Not on file  Occupational History  .  Occupation: Pharmacist, hospital  Tobacco Use  . Smoking status: Never Smoker  . Smokeless tobacco: Never Used  Substance and Sexual Activity  . Alcohol use: Yes    Alcohol/week: 2.0 - 3.0 standard drinks    Types: 2 - 3 Glasses of wine per week    Comment: occasionally  . Drug use: No  . Sexual activity: Yes    Partners: Male    Birth control/protection: Condom  Other Topics Concern  . Not on file  Social History Narrative  . Not on file   Social Determinants of Health   Financial Resource Strain:   . Difficulty of Paying Living Expenses:   Food Insecurity:   . Worried About Charity fundraiser in the Last Year:   . Arboriculturist in the Last Year:   Transportation Needs:   . Film/video editor (Medical):   Marland Kitchen Lack of Transportation (Non-Medical):   Physical Activity:   . Days of Exercise per Week:   . Minutes of Exercise per Session:   Stress:   . Feeling of Stress :   Social Connections:   . Frequency of Communication with Friends and Family:   . Frequency of Social Gatherings with Friends and Family:   . Attends Religious Services:   . Active Member  of Clubs or Organizations:   . Attends Archivist Meetings:   Marland Kitchen Marital Status:   Intimate Partner Violence:   . Fear of Current or Ex-Partner:   . Emotionally Abused:   Marland Kitchen Physically Abused:   . Sexually Abused:     Review of Systems: See HPI, otherwise negative ROS  Physical Exam: BP (!) 152/84   Pulse 92   Temp 98.1 F (36.7 C) (Temporal)   Ht 5\' 6"  (1.676 m)   Wt 100.2 kg   SpO2 98%   BMI 35.67 kg/m  General:   Alert,  pleasant and cooperative in NAD Head:  Normocephalic and atraumatic. Neck:  Supple; no masses or thyromegaly. Lungs:  Clear throughout to auscultation.    Heart:  Regular rate and rhythm. Abdomen:  Soft, nontender and nondistended. Normal bowel sounds, without guarding, and without rebound.   Neurologic:  Alert and  oriented x4;  grossly normal  neurologically.  Impression/Plan: Mikayla Odom is now here to undergo a screening colonoscopy.  Risks, benefits, and alternatives regarding colonoscopy have been reviewed with the patient.  Questions have been answered.  All parties agreeable.

## 2020-02-21 ENCOUNTER — Encounter: Payer: Self-pay | Admitting: *Deleted

## 2020-02-22 ENCOUNTER — Encounter: Payer: Self-pay | Admitting: Gastroenterology

## 2020-02-22 LAB — SURGICAL PATHOLOGY

## 2020-06-14 ENCOUNTER — Encounter: Payer: Self-pay | Admitting: Podiatry

## 2020-06-14 ENCOUNTER — Other Ambulatory Visit: Payer: Self-pay | Admitting: Podiatry

## 2020-06-14 ENCOUNTER — Ambulatory Visit (INDEPENDENT_AMBULATORY_CARE_PROVIDER_SITE_OTHER): Payer: BC Managed Care – PPO

## 2020-06-14 ENCOUNTER — Other Ambulatory Visit: Payer: Self-pay

## 2020-06-14 ENCOUNTER — Ambulatory Visit (INDEPENDENT_AMBULATORY_CARE_PROVIDER_SITE_OTHER): Payer: BC Managed Care – PPO | Admitting: Podiatry

## 2020-06-14 DIAGNOSIS — M67471 Ganglion, right ankle and foot: Secondary | ICD-10-CM

## 2020-06-14 DIAGNOSIS — M779 Enthesopathy, unspecified: Secondary | ICD-10-CM

## 2020-06-14 NOTE — Progress Notes (Signed)
Subjective:  Patient ID: Mikayla Odom, female    DOB: 03/22/1971,  MRN: [id number] HPI Chief Complaint  Patient presents with  . Foot Pain    Patient presents today for lump on right lateral ankle x 2 years, cyst on top of right foot and RLE x 2 months and arch pain right foot x 2 months.  She says no pain to the lumps, but feet cramp at night   No treatment has been done    49 y.o. female presents with the above complaint.   ROS: Denies fever chills nausea vomiting muscle aches pains calf pain back pain chest pain shortness of breath.  Past Medical History:  Diagnosis Date  . Family history of breast cancer    2/17, 10/18 cancer genetic testing letter sent  . History of mammogram 10/04/2015   Birads 1  . Leiomyoma   . Migraine    hormonal linked  . Motion sickness   . PONV (postoperative nausea and vomiting)   . Vertigo    Past Surgical History:  Procedure Laterality Date  . BREAST BIOPSY Right   . BREAST SURGERY Right 2004   biopsy - fibroadenoma  . CLEFT LIP REPAIR    . COLONOSCOPY WITH PROPOFOL N/A 02/18/2020   Procedure: COLONOSCOPY WITH BIOPSY;  Surgeon: [name], MD;  Location: [place];  Service: Endoscopy;  Laterality: N/A;  . POLYPECTOMY N/A 02/18/2020   Procedure: POLYPECTOMY;  Surgeon: [name], MD;  Location: [place];  Service: Endoscopy;  Laterality: N/A;  . WISDOM TOOTH EXTRACTION      Current Outpatient Medications:  .  cetirizine (ZYRTEC ALLERGY) 10 MG tablet, Take by mouth., Disp: , Rfl:  .  Cholecalciferol (VITAMIN D3) 1000 units CAPS, Take by mouth., Disp: , Rfl:  .  Magnesium 100 MG TABS, Take by mouth., Disp: , Rfl:  .  vitamin B-12 (CYANOCOBALAMIN) 1000 MCG tablet, Take by mouth., Disp: , Rfl:   Allergies  Allergen Reactions  . Penicillins Other (See Comments)    Very dizzy   Review of Systems Objective:  There were no vitals filed for this visit.  General: Well developed, nourished, in no acute distress,  alert and oriented x3   Dermatological: Skin is warm, dry and supple bilateral. Nails x 10 are well maintained; remaining integument appears unremarkable at this time. There are no open sores, no preulcerative lesions, no rash or signs of infection present.  She has a lipoma right ankle and a very small area of fluid collection just distal to the laciniate ligament with the extensor tendons.  Vascular: Dorsalis Pedis artery and Posterior Tibial artery pedal pulses are 2/4 bilateral with immedate capillary fill time. Pedal hair growth present. No varicosities and no lower extremity edema present bilateral.   Neruologic: Grossly intact via light touch bilateral. Vibratory intact via tuning fork bilateral. Protective threshold with Semmes Wienstein monofilament intact to all pedal sites bilateral. Patellar and Achilles deep tendon reflexes 2+ bilateral. No Babinski or clonus noted bilateral.   Musculoskeletal: No gross boney pedal deformities bilateral. No pain, crepitus, or limitation noted with foot and ankle range of motion bilateral. Muscular strength 5/5 in all groups tested bilateral.  Hallux abductovalgus deformity of the right foot with a rigid hammertoe deformity at the level of the second metatarsophalangeal joint right.  This is tender on palpation and attempted range of motion.  Gait: Unassisted, Nonantalgic.    Radiographs:  Radiographs taken today demonstrate an increase in the first intermetatarsal angle of  the right foot greater than normal value.  Hallux abductus angle greater than normal value.  Plantarflexed second metatarsal with a hammertoe deformity that appears to be rigid in nature.  Soft tissue swelling anterior lateral ankle.  Otherwise no significant osseous abnormalities.  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  Plan: Discussed etiology pathology conservative versus surgical  therapies.  At this point we discussed in great detail surgical management of these procedures and complications however at this point she would like to hold off on any surgery will notify me in the future.     Mikayla Odom T. [place], [place]

## 2020-06-15 ENCOUNTER — Other Ambulatory Visit: Payer: Self-pay | Admitting: Family Medicine

## 2020-06-15 DIAGNOSIS — Z1231 Encounter for screening mammogram for malignant neoplasm of breast: Secondary | ICD-10-CM

## 2020-08-03 ENCOUNTER — Encounter: Payer: Self-pay | Admitting: Advanced Practice Midwife

## 2020-08-03 ENCOUNTER — Other Ambulatory Visit: Payer: Self-pay

## 2020-08-03 ENCOUNTER — Ambulatory Visit (INDEPENDENT_AMBULATORY_CARE_PROVIDER_SITE_OTHER): Payer: BC Managed Care – PPO | Admitting: Advanced Practice Midwife

## 2020-08-03 VITALS — BP 144/85 | Ht 66.0 in | Wt 224.0 lb

## 2020-08-03 DIAGNOSIS — R350 Frequency of micturition: Secondary | ICD-10-CM | POA: Diagnosis not present

## 2020-08-03 DIAGNOSIS — Z0000 Encounter for general adult medical examination without abnormal findings: Secondary | ICD-10-CM | POA: Diagnosis not present

## 2020-08-03 LAB — POCT URINALYSIS DIPSTICK
Bilirubin, UA: NEGATIVE
Glucose, UA: NEGATIVE
Ketones, UA: NEGATIVE
Leukocytes, UA: NEGATIVE
Nitrite, UA: NEGATIVE
Protein, UA: NEGATIVE
Spec Grav, UA: 1.01 (ref 1.010–1.025)
Urobilinogen, UA: 0.2 E.U./dL
pH, UA: 5 (ref 5.0–8.0)

## 2020-08-03 NOTE — Progress Notes (Signed)
Gynecology Annual Exam  PCP: Sofie Hartigan, MD  Chief Complaint:  Chief Complaint  Patient presents with  . Gynecologic Exam  . Urinary Tract Infection    back pain   . Urinary Frequency    History of Present Illness:Patient is a 49 y.o. [id number] presents for annual exam. The patient has no gyn complaints today. She does complain of urinary frequency and low back ache. She suspects UTI. She does have mild hot flashes. She prefers to treat without hormone replacement- alternative and complementary modes.  She is scheduled for annual mammogram next week. She had a colonoscopy in May of this year. A pre-cancerous polyp was found. She will have follow up in 5 years.  LMP: July 2021, Patient is premenopausal. Menarche:not applicable Average Interval: every 100-200 days Duration of flow: 2-3 days Heavy Menses: no Clots: no Intermenstrual Bleeding: no Postcoital Bleeding: no Dysmenorrhea: no  The patient is sexually active. She denies dyspareunia. She does have vaginal dryness and uses lubrication. The patient does perform self breast exams.  There is no notable family history of breast or ovarian cancer in her family. Maternal cousin and paternal aunt were greater than age 67 at diagnosis.  The patient wears seatbelts: yes.   The patient has regular exercise: she has a [place] and uses it occasionally. She also walks occasionally. She tries to eat a healthy diet and admits to eating increased calories. She admits adequate hydration and sleep.    The patient denies current symptoms of depression.     Review of Systems: Review of Systems  Constitutional: Negative for chills and fever.  HENT: Negative for congestion, ear discharge, ear pain, hearing loss, sinus pain and sore throat.   Eyes: Negative for blurred vision and double vision.  Respiratory: Negative for cough, shortness of breath and wheezing.   Cardiovascular: Negative for chest pain, palpitations and leg swelling.   Gastrointestinal: Negative for abdominal pain, blood in stool, constipation, diarrhea, heartburn, melena, nausea and vomiting.  Genitourinary: Positive for frequency. Negative for dysuria, flank pain, hematuria and urgency.  Musculoskeletal: Positive for back pain. Negative for joint pain and myalgias.  Skin: Negative for itching and rash.  Neurological: Negative for dizziness, tingling, tremors, sensory change, speech change, focal weakness, seizures, loss of consciousness, weakness and headaches.  Endo/Heme/Allergies: Negative for environmental allergies. Does not bruise/bleed easily.  Psychiatric/Behavioral: Negative for depression, hallucinations, memory loss, substance abuse and suicidal ideas. The patient is not nervous/anxious and does not have insomnia.     Past Medical History:  Patient Active Problem List   Diagnosis Date Noted  . Special screening for malignant neoplasms, colon   . Polyp of ascending colon   . Perimenopausal symptoms 10/16/2018  . Migraine   . Acute chest pain 12/16/2016  . 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 Ganglion cyst 01/10/2014  . AR (allergic rhinitis) 07/09/2012    Past Surgical History:  Past Surgical History:  Procedure Laterality Date  . BREAST BIOPSY Right   . BREAST SURGERY Right 2004   biopsy - fibroadenoma  . CLEFT LIP REPAIR    . COLONOSCOPY WITH PROPOFOL N/A 02/18/2020   Procedure: COLONOSCOPY WITH BIOPSY;  Surgeon: [name], MD;  Location: [place];  Service: Endoscopy;  Laterality: N/A;  . POLYPECTOMY N/A 02/18/2020   Procedure: POLYPECTOMY;  Surgeon: [name], MD;  Location: [facility];  Service: Endoscopy;  Laterality: N/A;  . WISDOM TOOTH EXTRACTION      Gynecologic History:  No LMP recorded.  Patient is premenopausal. Last Pap: 1 year ago Results were:  no abnormalities  Last mammogram: 1 year ago Results were: BI-RAD I  Obstetric History: [id number]  Family History:  Family History  Problem  Relation Age of Onset  . Breast cancer Paternal Aunt 13  . Heart disease Maternal Grandfather   . Breast cancer Cousin 58  . Breast cancer Cousin 36  . Breast cancer Paternal Aunt 34  . Healthy Mother   . Atrial fibrillation Father   . Colon cancer Maternal Aunt 47       MGA    Social History:  Social History   Socioeconomic History  . Marital status: Married    Spouse name: Not on file  . Number of children: 3  . Years of education: 44  . Highest education level: Not on file  Occupational History  . Occupation: Pharmacist, hospital  Tobacco Use  . Smoking status: Never Smoker  . Smokeless tobacco: Never Used  Vaping Use  . Vaping Use: Never used  Substance and Sexual Activity  . Alcohol use: Yes    Alcohol/week: 2.0 - 3.0 standard drinks    Types: 2 - 3 Glasses of wine per week    Comment: occasionally  . Drug use: No  . Sexual activity: Yes    Partners: Male    Birth control/protection: Condom  Other Topics Concern  . Not on file  Social History Narrative  . Not on file   Social Determinants of Health   Financial Resource Strain:   . Difficulty of Paying Living Expenses: Not on file  Food Insecurity:   . Worried About Charity fundraiser in the Last Year: Not on file  . Ran Out of Food in the Last Year: Not on file  Transportation Needs:   . Lack of Transportation (Medical): Not on file  . Lack of Transportation (Non-Medical): Not on file  Physical Activity:   . Days of Exercise per Week: Not on file  . Minutes of Exercise per Session: Not on file  Stress:   . Feeling of Stress : Not on file  Social Connections:   . Frequency of Communication with Friends and Family: Not on file  . Frequency of Social Gatherings with Friends and Family: Not on file  . Attends Religious Services: Not on file  . Active Member of Clubs or Organizations: Not on file  . Attends Archivist Meetings: Not on file  . Marital Status: Not on file  Intimate Partner Violence:   .  Fear of Current or Ex-Partner: Not on file  . Emotionally Abused: Not on file  . Physically Abused: Not on file  . Sexually Abused: Not on file    Allergies:  Allergies  Allergen Reactions  . Penicillins Other (See Comments)    Very dizzy    Medications: Prior to Admission medications   Medication Sig Start Date End Date Taking? Authorizing Provider  cetirizine (ZYRTEC ALLERGY) 10 MG tablet Take by mouth.   Yes [provider]  Cholecalciferol (VITAMIN D3) 1000 units CAPS Take by mouth.   Yes [provider]  Magnesium 100 MG TABS Take by mouth.   Yes [provider]  vitamin B-12 (CYANOCOBALAMIN) 1000 MCG tablet Take by mouth.   Yes [provider]    Physical Exam Vitals: Blood pressure (!) 144/85, height 5\' 6"  (1.676 m), weight 224 lb (101.6 kg).  General: NAD HEENT: normocephalic, anicteric Thyroid: no enlargement, no palpable nodules Pulmonary: No increased work  of breathing, CTAB Cardiovascular: RRR, distal pulses 2+ Breast: Breast symmetrical, no tenderness, no palpable nodules or masses, no skin or nipple retraction present, no nipple discharge.  No axillary or supraclavicular lymphadenopathy. Abdomen: NABS, soft, non-tender, non-distended.  Umbilicus without lesions.  No hepatomegaly, splenomegaly or masses palpable. No evidence of hernia  Genitourinary: deferred for no concerns, PAP interval Extremities: no edema, erythema, or tenderness Neurologic: Grossly intact Psychiatric: mood appropriate, affect full  Results for KASHIRA, BEHUNIN (MRN [id number]) as of 08/03/2020 09:18  Ref. Range 08/03/2020 08:33  Bilirubin, UA Unknown neg  Glucose Latest Ref Range: Negative  Negative  Ketones, UA Unknown neg  Leukocytes,UA Latest Ref Range: Negative  Negative  Nitrite, UA Unknown neg  pH, UA Latest Ref Range: 5.0 - 8.0  5.0  Protein,UA Latest Ref Range: Negative  Negative  Specific Gravity, UA Latest Ref Range: 1.010 - 1.025  1.010   Urobilinogen, UA Latest Ref Range: 0.2 or 1.0 E.U./dL 0.2  RBC, UA Unknown trace     Assessment: 48 y.o. [id number] routine annual exam  Plan: Problem List Items Addressed This Visit    None    Visit Diagnoses    Well woman exam without gynecological exam    -  Primary   Urine frequency       Relevant Orders   POCT urinalysis dipstick (Completed)   Urine Culture      1) Mammogram - recommend yearly screening mammogram.  Mammogram is next week  2) STI screening  was offered and declined  3) ASCCP guidelines and rationale discussed.  Patient opts for every 3 years screening interval  4) Osteoporosis  - per USPTF routine screening DEXA at age 33  Consider FDA-approved medical therapies in postmenopausal women and men aged 25 years and older, based on the following: a) A hip or vertebral (clinical or morphometric) fracture b) T-score ? -2.5 at the femoral neck or spine after appropriate evaluation to exclude secondary causes C) Low bone mass (T-score between -1.0 and -2.5 at the femoral neck or spine) and a 10-year probability of a hip fracture ? 3% or a 10-year probability of a major osteoporosis-related fracture ? 20% based on the [place]-adapted WHO algorithm   5) Routine healthcare maintenance including cholesterol, diabetes screening discussed managed by PCP  6) Colonoscopy is up to date.  Screening recommended starting at age 53 for average risk individuals, age 7 for individuals deemed at increased risk (including African Americans) and recommended to continue until age 41.  For patient age 63-85 individualized approach is recommended.  Gold standard screening is via colonoscopy, Cologuard screening is an acceptable alternative for patient unwilling or unable to undergo colonoscopy.  "Colorectal cancer screening for average?risk adults: 2018 guideline update from the American Cancer Society"CA: A Cancer Journal for Clinicians: Feb 26, 2017   7) Follow up as needed after urine  culture results  8) Return in about 1 year (around 08/03/2021) for annual established gyn.    Christean Leaf, CNM [facility] 08/03/20, 9:07 AM

## 2020-08-03 NOTE — Patient Instructions (Signed)
Menopause Menopause is the normal time of life when menstrual periods stop completely. It is usually confirmed by 12 months without a menstrual period. The transition to menopause (perimenopause) most often happens between the ages of 26 and 30. During perimenopause, hormone levels change in your body, which can cause symptoms and affect your health. Menopause may increase your risk for:  Loss of bone (osteoporosis), which causes bone breaks (fractures).  Depression.  Hardening and narrowing of the arteries (atherosclerosis), which can cause heart attacks and strokes. What are the causes? This condition is usually caused by a natural change in hormone levels that happens as you get older. The condition may also be caused by surgery to remove both ovaries (bilateral oophorectomy). What increases the risk? This condition is more likely to start at an earlier age if you have certain medical conditions or treatments, including:  A tumor of the pituitary gland in the brain.  A disease that affects the ovaries and hormone production.  Radiation treatment for cancer.  Certain cancer treatments, such as chemotherapy or hormone (anti-estrogen) therapy.  Heavy smoking and excessive alcohol use.  Family history of early menopause. This condition is also more likely to develop earlier in women who are very thin. What are the signs or symptoms? Symptoms of this condition include:  Hot flashes.  Irregular menstrual periods.  Night sweats.  Changes in feelings about sex. This could be a decrease in sex drive or an increased comfort around your sexuality.  Vaginal dryness and thinning of the vaginal walls. This may cause painful intercourse.  Dryness of the skin and development of wrinkles.  Headaches.  Problems sleeping (insomnia).  Mood swings or irritability.  Memory problems.  Weight gain.  Hair growth on the face and chest.  Bladder infections or problems with urinating. How  is this diagnosed? This condition is diagnosed based on your medical history, a physical exam, your age, your menstrual history, and your symptoms. Hormone tests may also be done. How is this treated? In some cases, no treatment is needed. You and your health care provider should make a decision together about whether treatment is necessary. Treatment will be based on your individual condition and preferences. Treatment for this condition focuses on managing symptoms. Treatment may include:  Menopausal hormone therapy (MHT).  Medicines to treat specific symptoms or complications.  Acupuncture.  Vitamin or herbal supplements. Before starting treatment, make sure to let your health care provider know if you have a personal or family history of:  Heart disease.  Breast cancer.  Blood clots.  Diabetes.  Osteoporosis. Follow these instructions at home: Lifestyle  Do not use any products that contain nicotine or tobacco, such as cigarettes and e-cigarettes. If you need help quitting, ask your health care provider.  Get at least 30 minutes of physical activity on 5 or more days each week.  Avoid alcoholic and caffeinated beverages, as well as spicy foods. This may help prevent hot flashes.  Get 7-8 hours of sleep each night.  If you have hot flashes, try: ? Dressing in layers. ? Avoiding things that may trigger hot flashes, such as spicy food, warm places, or stress. ? Taking slow, deep breaths when a hot flash starts. ? Keeping a fan in your home and office.  Find ways to manage stress, such as deep breathing, meditation, or journaling.  Consider going to group therapy with other women who are having menopause symptoms. Ask your health care provider about recommended group therapy meetings. Eating and  drinking  Eat a healthy, balanced diet that contains whole grains, lean protein, low-fat dairy, and plenty of fruits and vegetables.  Your health care provider may recommend  adding more soy to your diet. Foods that contain soy include tofu, tempeh, and soy milk.  Eat plenty of foods that contain calcium and vitamin D for bone health. Items that are rich in calcium include low-fat milk, yogurt, beans, almonds, sardines, broccoli, and kale. Medicines  Take over-the-counter and prescription medicines only as told by your health care provider.  Talk with your health care provider before starting any herbal supplements. If prescribed, take vitamins and supplements as told by your health care provider. These may include: ? Calcium. Women age 64 and older should get 1,200 mg (milligrams) of calcium every day. ? Vitamin D. Women need 600-800 International Units of vitamin D each day. ? Vitamins B12 and B6. Aim for 50 micrograms of B12 and 1.5 mg of B6 each day. General instructions  Keep track of your menstrual periods, including: ? When they occur. ? How heavy they are and how long they last. ? How much time passes between periods.  Keep track of your symptoms, noting when they start, how often you have them, and how long they last.  Use vaginal lubricants or moisturizers to help with vaginal dryness and improve comfort during sex.  Keep all follow-up visits as told by your health care provider. This is important. This includes any group therapy or counseling. Contact a health care provider if:  You are still having menstrual periods after age 29.  You have pain during sex.  You have not had a period for 12 months and you develop vaginal bleeding. Get help right away if:  You have: ? Severe depression. ? Excessive vaginal bleeding. ? Pain when you urinate. ? A fast or irregular heart beat (palpitations). ? Severe headaches. ? Abdomen (abdominal) pain or severe indigestion.  You fell and you think you have a broken bone.  You develop leg or chest pain.  You develop vision problems.  You feel a lump in your breast. Summary  Menopause is the normal  time of life when menstrual periods stop completely. It is usually confirmed by 12 months without a menstrual period.  The transition to menopause (perimenopause) most often happens between the ages of 79 and 49.  Symptoms can be managed through medicines, lifestyle changes, and complementary therapies such as acupuncture.  Eat a balanced diet that is rich in nutrients to promote bone health and heart health and to manage symptoms during menopause. This information is not intended to replace advice given to you by your health care provider. Make sure you discuss any questions you have with your health care provider. Document Revised: 08/29/2017 Document Reviewed: 10/19/2016 Elsevier Patient Education  2020 [place] Maintenance, Female Adopting a healthy lifestyle and getting preventive care are important in promoting health and wellness. Ask your health care provider about:  The right schedule for you to have regular tests and exams.  Things you can do on your own to prevent diseases and keep yourself healthy. What should I know about diet, weight, and exercise? Eat a healthy diet   Eat a diet that includes plenty of vegetables, fruits, low-fat dairy products, and lean protein.  Do not eat a lot of foods that are high in solid fats, added sugars, or sodium. Maintain a healthy weight Body mass index (BMI) is used to identify weight problems. It estimates body fat based  on height and weight. Your health care provider can help determine your BMI and help you achieve or maintain a healthy weight. Get regular exercise Get regular exercise. This is one of the most important things you can do for your health. Most adults should:  Exercise for at least 150 minutes each week. The exercise should increase your heart rate and make you sweat (moderate-intensity exercise).  Do strengthening exercises at least twice a week. This is in addition to the moderate-intensity exercise.  Spend  less time sitting. Even light physical activity can be beneficial. Watch cholesterol and blood lipids Have your blood tested for lipids and cholesterol at 50 years of age, then have this test every 5 years. Have your cholesterol levels checked more often if:  Your lipid or cholesterol levels are high.  You are older than 51 years of age.  You are at high risk for heart disease. What should I know about cancer screening? Depending on your health history and family history, you may need to have cancer screening at various ages. This may include screening for:  Breast cancer.  Cervical cancer.  Colorectal cancer.  Skin cancer.  Lung cancer. What should I know about heart disease, diabetes, and high blood pressure? Blood pressure and heart disease  High blood pressure causes heart disease and increases the risk of stroke. This is more likely to develop in people who have high blood pressure readings, are of African descent, or are overweight.  Have your blood pressure checked: ? Every 3-5 years if you are 76-70 years of age. ? Every year if you are 7 years old or older. Diabetes Have regular diabetes screenings. This checks your fasting blood sugar level. Have the screening done:  Once every three years after age 33 if you are at a normal weight and have a low risk for diabetes.  More often and at a younger age if you are overweight or have a high risk for diabetes. What should I know about preventing infection? Hepatitis B If you have a higher risk for hepatitis B, you should be screened for this virus. Talk with your health care provider to find out if you are at risk for hepatitis B infection. Hepatitis C Testing is recommended for:  Everyone born from [birth year] through 1967.  Anyone with known risk factors for hepatitis C. Sexually transmitted infections (STIs)  Get screened for STIs, including gonorrhea and chlamydia, if: ? You are sexually active and are younger than 51  years of age. ? You are older than 51 years of age and your health care provider tells you that you are at risk for this type of infection. ? Your sexual activity has changed since you were last screened, and you are at increased risk for chlamydia or gonorrhea. Ask your health care provider if you are at risk.  Ask your health care provider about whether you are at high risk for HIV. Your health care provider may recommend a prescription medicine to help prevent HIV infection. If you choose to take medicine to prevent HIV, you should first get tested for HIV. You should then be tested every 3 months for as long as you are taking the medicine. Pregnancy  If you are about to stop having your period (premenopausal) and you may become pregnant, seek counseling before you get pregnant.  Take 400 to 800 micrograms (mcg) of folic acid every day if you become pregnant.  Ask for birth control (contraception) if you want to prevent pregnancy.  Osteoporosis and menopause Osteoporosis is a disease in which the bones lose minerals and strength with aging. This can result in bone fractures. If you are 50 years old or older, or if you are at risk for osteoporosis and fractures, ask your health care provider if you should:  Be screened for bone loss.  Take a calcium or vitamin D supplement to lower your risk of fractures.  Be given hormone replacement therapy (HRT) to treat symptoms of menopause. Follow these instructions at home: Lifestyle  Do not use any products that contain nicotine or tobacco, such as cigarettes, e-cigarettes, and chewing tobacco. If you need help quitting, ask your health care provider.  Do not use street drugs.  Do not share needles.  Ask your health care provider for help if you need support or information about quitting drugs. Alcohol use  Do not drink alcohol if: ? Your health care provider tells you not to drink. ? You are pregnant, may be pregnant, or are planning to  become pregnant.  If you drink alcohol: ? Limit how much you use to 0-1 drink a day. ? Limit intake if you are breastfeeding.  Be aware of how much alcohol is in your drink. In the [place] one drink equals one 12 oz bottle of beer (355 mL), one 5 oz glass of wine (148 mL), or one 1 oz glass of hard liquor (44 mL). General instructions  Schedule regular health, dental, and eye exams.  Stay current with your vaccines.  Tell your health care provider if: ? You often feel depressed. ? You have ever been abused or do not feel safe at home. Summary  Adopting a healthy lifestyle and getting preventive care are important in promoting health and wellness.  Follow your health care provider's instructions about healthy diet, exercising, and getting tested or screened for diseases.  Follow your health care provider's instructions on monitoring your cholesterol and blood pressure. This information is not intended to replace advice given to you by your health care provider. Make sure you discuss any questions you have with your health care provider. Document Revised: 09/09/2018 Document Reviewed: 09/09/2018 Elsevier Patient Education  2020 [employer].

## 2020-08-05 LAB — URINE CULTURE

## 2020-08-09 ENCOUNTER — Other Ambulatory Visit: Payer: Self-pay

## 2020-08-09 ENCOUNTER — Ambulatory Visit
Admission: RE | Admit: 2020-08-09 | Discharge: 2020-08-09 | Disposition: A | Discharge status: Home / Self Care | Payer: BC Managed Care – PPO | Source: Ambulatory Visit | Attending: Family Medicine | Admitting: Family Medicine

## 2020-08-09 DIAGNOSIS — Z1231 Encounter for screening mammogram for malignant neoplasm of breast: Secondary | ICD-10-CM | POA: Insufficient documentation

## 2021-04-11 ENCOUNTER — Encounter: Payer: Self-pay | Admitting: Advanced Practice Midwife

## 2021-04-11 ENCOUNTER — Ambulatory Visit: Payer: BC Managed Care – PPO | Admitting: Advanced Practice Midwife

## 2021-04-11 ENCOUNTER — Other Ambulatory Visit: Payer: Self-pay

## 2021-04-11 VITALS — BP 140/90 | Ht 66.0 in | Wt 227.0 lb

## 2021-04-11 DIAGNOSIS — R102 Pelvic and perineal pain: Secondary | ICD-10-CM

## 2021-04-11 NOTE — Progress Notes (Signed)
Patient ID: Mikayla Odom, female   DOB: [birth year], 50 y.o.   MRN: [id number]  Reason for Consult: Pelvic Pain    Subjective:  HPI:  Mikayla Odom is a 50 y.o. female being seen for left side pelvic pain that began about 2 weeks ago as cramping. The pain has been sharp the last few days which she rates at 6-7 out of 10. She has daily pain that is intermittent and lasts 15-30 minutes at a time. She has recently marked 1 year without periods. Her last PAP smear was in 2020 and was normal. Her colonoscopy was done in 2021 and she will have repeat in 5 years. She denies any bowel concerns and has a regular BM. She has a history of an ovarian cyst that resolved. She denies any vaginal or urinary concerns.  Past Medical History:  Diagnosis Date   Family history of breast cancer    2/17, 10/18 cancer genetic testing letter sent   History of mammogram 10/04/2015   Birads 1   Leiomyoma    Migraine    hormonal linked   Motion sickness    PONV (postoperative nausea and vomiting)    Vertigo    Family History  Problem Relation Age of Onset   Breast cancer Paternal Aunt 57   Heart disease Maternal Grandfather    Breast cancer Cousin 7   Breast cancer Cousin 57   Breast cancer Paternal Aunt 82   Healthy Mother    Atrial fibrillation Father    Colon cancer Maternal Aunt 58       MGA   Past Surgical History:  Procedure Laterality Date   BREAST BIOPSY Right    neg   BREAST SURGERY Right 2004   biopsy - fibroadenoma   CLEFT LIP REPAIR     COLONOSCOPY WITH PROPOFOL N/A 02/18/2020   Procedure: COLONOSCOPY WITH BIOPSY;  Surgeon: [name], MD;  Location: [facility];  Service: Endoscopy;  Laterality: N/A;   POLYPECTOMY N/A 02/18/2020   Procedure: POLYPECTOMY;  Surgeon: [name], MD;  Location: [facility];  Service: Endoscopy;  Laterality: N/A;   WISDOM TOOTH EXTRACTION      Short Social History:  Social History   Tobacco Use   Smoking status: Never    Smokeless tobacco: Never  Substance Use Topics   Alcohol use: Yes    Alcohol/week: 2.0 - 3.0 standard drinks    Types: 2 - 3 Glasses of wine per week    Comment: occasionally    Allergies  Allergen Reactions   Penicillins Other (See Comments)    Very dizzy    Current Outpatient Medications  Medication Sig Dispense Refill   cetirizine (ZYRTEC) 10 MG tablet Take by mouth.     Cholecalciferol (VITAMIN D3) 1000 units CAPS Take by mouth.     Magnesium 100 MG TABS Take by mouth.     vitamin B-12 (CYANOCOBALAMIN) 1000 MCG tablet Take by mouth.     No current facility-administered medications for this visit.    Review of Systems  Constitutional:  Negative for chills and fever.  HENT:  Negative for congestion, ear discharge, ear pain, hearing loss, sinus pain and sore throat.   Eyes:  Negative for blurred vision and double vision.  Respiratory:  Negative for cough, shortness of breath and wheezing.   Cardiovascular:  Negative for chest pain, palpitations and leg swelling.  Gastrointestinal:  Negative for abdominal pain, blood in stool, constipation, diarrhea, heartburn, melena, nausea and vomiting.  Genitourinary:  Negative for dysuria, flank pain, frequency, hematuria and urgency.       Positive for left side pelvic pain  Musculoskeletal:  Negative for back pain, joint pain and myalgias.  Skin:  Negative for itching and rash.  Neurological:  Negative for dizziness, tingling, tremors, sensory change, speech change, focal weakness, seizures, loss of consciousness, weakness and headaches.  Endo/Heme/Allergies:  Negative for environmental allergies. Does not bruise/bleed easily.  Psychiatric/Behavioral:  Negative for depression, hallucinations, memory loss, substance abuse and suicidal ideas. The patient is not nervous/anxious and does not have insomnia.        Objective:  Objective   Vitals:   04/11/21 1129  BP: 140/90  Weight: 227 lb (103 kg)  Height: 5\' 6"  (1.676 m)   Body  mass index is 36.64 kg/m. Constitutional: Well nourished, well developed female in no acute distress.  HEENT: normal Skin: Warm and dry.  Cardiovascular: Regular rate and rhythm.   Extremity:no edema Respiratory: Clear to auscultation bilateral. Normal respiratory effort Abdomen: mildly tender lower left quadrant Neuro: DTRs 2+, Cranial nerves grossly intact Psych: Alert and Oriented x3. No memory deficits. Normal mood and affect.  MS: normal gait, normal bilateral lower extremity ROM/strength/stability.  Pelvic exam:  is limited by body habitus EGBUS: within normal limits Vagina: within normal limits and with normal mucosa  Cervix: no CMT Uterus: not enlarged, mobile, normal contour Adnexa: not enlarged, non-tender on right, pain response to palpation on left   Assessment/Plan:     50 y.o. G62 P3013 female recently postmenopausal with left side pelvic pain, possible ovarian cyst  Gyn ultrasound with follow up afterwards    [facility] 04/11/2021, 12:04 PM

## 2021-04-24 ENCOUNTER — Other Ambulatory Visit: Payer: Self-pay

## 2021-04-24 ENCOUNTER — Ambulatory Visit
Admission: RE | Admit: 2021-04-24 | Discharge: 2021-04-24 | Disposition: A | Discharge status: Home / Self Care | Payer: No Typology Code available for payment source | Source: Ambulatory Visit | Attending: Advanced Practice Midwife | Admitting: Advanced Practice Midwife

## 2021-04-24 DIAGNOSIS — R102 Pelvic and perineal pain: Secondary | ICD-10-CM | POA: Diagnosis present

## 2021-04-25 ENCOUNTER — Encounter: Payer: Self-pay | Admitting: Advanced Practice Midwife

## 2021-04-25 ENCOUNTER — Ambulatory Visit (INDEPENDENT_AMBULATORY_CARE_PROVIDER_SITE_OTHER): Payer: No Typology Code available for payment source | Admitting: Advanced Practice Midwife

## 2021-04-25 VITALS — BP 149/91 | Ht 66.0 in | Wt 224.0 lb

## 2021-04-25 DIAGNOSIS — Z09 Encounter for follow-up examination after completed treatment for conditions other than malignant neoplasm: Secondary | ICD-10-CM | POA: Diagnosis not present

## 2021-04-25 DIAGNOSIS — R9389 Abnormal findings on diagnostic imaging of other specified body structures: Secondary | ICD-10-CM

## 2021-04-25 NOTE — Progress Notes (Signed)
Patient ID: Mikayla Odom, female   DOB: 1970/11/06, 50 y.o.   MRN: [id number]  Reason for Consult: Follow-up   Subjective:  HPI:  Mikayla Odom is a 50 y.o. female being seen for follow up after gyn ultrasound. Her left sided pelvic pain has resolved. Gyn ultrasound is essentially normal with the exception of a slightly increased endometrial thickness and 2 small fibroids. Per Dr. Kenton Kingfisher, endometrial biopsy is recommended.   Past Medical History:  Diagnosis Date   Family history of breast cancer    2/17, 10/18 cancer genetic testing letter sent   History of mammogram 10/04/2015   Birads 1   Leiomyoma    Migraine    hormonal linked   Motion sickness    PONV (postoperative nausea and vomiting)    Vertigo    Family History  Problem Relation Age of Onset   Breast cancer Paternal Aunt 16   Heart disease Maternal Grandfather    Breast cancer Cousin 4   Breast cancer Cousin 42   Breast cancer Paternal Aunt 80   Healthy Mother    Atrial fibrillation Father    Colon cancer Maternal Aunt 70       MGA   Past Surgical History:  Procedure Laterality Date   BREAST BIOPSY Right    neg   BREAST SURGERY Right 2004   biopsy - fibroadenoma   CLEFT LIP REPAIR     COLONOSCOPY WITH PROPOFOL N/A 02/18/2020   Procedure: COLONOSCOPY WITH BIOPSY;  Surgeon: [name], MD;  Location: [facility];  Service: Endoscopy;  Laterality: N/A;   POLYPECTOMY N/A 02/18/2020   Procedure: POLYPECTOMY;  Surgeon: [name], MD;  Location: [facility];  Service: Endoscopy;  Laterality: N/A;   WISDOM TOOTH EXTRACTION      Short Social History:  Social History   Tobacco Use   Smoking status: Never   Smokeless tobacco: Never  Substance Use Topics   Alcohol use: Yes    Alcohol/week: 2.0 - 3.0 standard drinks    Types: 2 - 3 Glasses of wine per week    Comment: occasionally    Allergies  Allergen Reactions   Penicillins Other (See Comments)    Very dizzy    Current  Outpatient Medications  Medication Sig Dispense Refill   cetirizine (ZYRTEC) 10 MG tablet Take by mouth.     Cholecalciferol (VITAMIN D3) 1000 units CAPS Take by mouth.     Magnesium 100 MG TABS Take by mouth.     vitamin B-12 (CYANOCOBALAMIN) 1000 MCG tablet Take by mouth.     No current facility-administered medications for this visit.   Review of Systems  Constitutional:  Negative for chills and fever.  HENT:  Negative for congestion, ear discharge, ear pain, hearing loss, sinus pain and sore throat.   Eyes:  Negative for blurred vision and double vision.  Respiratory:  Negative for cough, shortness of breath and wheezing.   Cardiovascular:  Negative for chest pain, palpitations and leg swelling.  Gastrointestinal:  Negative for abdominal pain, blood in stool, constipation, diarrhea, heartburn, melena, nausea and vomiting.  Genitourinary:  Negative for dysuria, flank pain, frequency, hematuria and urgency.  Musculoskeletal:  Negative for back pain, joint pain and myalgias.  Skin:  Negative for itching and rash.  Neurological:  Negative for dizziness, tingling, tremors, sensory change, speech change, focal weakness, seizures, loss of consciousness, weakness and headaches.  Endo/Heme/Allergies:  Negative for environmental allergies. Does not bruise/bleed easily.  Psychiatric/Behavioral:  Negative for  depression, hallucinations, memory loss, substance abuse and suicidal ideas. The patient is not nervous/anxious and does not have insomnia.        Objective:  Objective   Vitals:   04/25/21 1109  BP: (!) 149/91  Weight: 224 lb (101.6 kg)  Height: '5\' 6"'$  (1.676 m)   Body mass index is 36.15 kg/m. Constitutional: Well nourished, well developed female in no acute distress.  HEENT: normal Skin: Warm and dry.  Respiratory: Normal respiratory effort Neuro: DTRs 2+, Cranial nerves grossly intact Psych: Alert and Oriented x3. No memory deficits. Normal mood and affect.  MS: normal gait,  normal bilateral lower extremity ROM/strength/stability.   Data: Narrative & Impression  CLINICAL DATA:  Left pelvic pain   EXAM: TRANSABDOMINAL AND TRANSVAGINAL ULTRASOUND OF PELVIS   TECHNIQUE: Both transabdominal and transvaginal ultrasound examinations of the pelvis were performed. Transabdominal technique was performed for global imaging of the pelvis including uterus, ovaries, adnexal regions, and pelvic cul-de-sac. It was necessary to proceed with endovaginal exam following the transabdominal exam to visualize the endometrium and ovaries.   COMPARISON:  04/30/2019   FINDINGS: Uterus   Measurements: 10.5 x 5.0 x 5.5 cm = volume: 151 mL. Two small fibroids are noted measuring 1.3 x 1.2 x 1.1 cm and 1.2 x 0.9 x 0.8 cm   Endometrium   Thickness: 7 mm.  No focal abnormality visualized.   Right ovary   Measurements: 1.8 x 0.9 x 1.6 cm = volume: 1.3 mL. Normal appearance/no adnexal mass.   Left ovary   Measurements: 1.9 x 1.1 x 0.9 cm = volume: 0.9 mL. Normal appearance/no adnexal mass.   Other findings   No abnormal free fluid.   IMPRESSION: 1. Two small uterine fibroids. 2. Abnormally thickened endometrium measuring up to 7 mm may be due to hyperplasia or neoplasia. Further evaluation with endometrial biopsy should be considered.    Electronically Signed   By: Miachel Roux M.D.   On: 04/24/2021 10:38   Time spent in care and management of patient/greater than 50% in consultation: 14 minutes Assessment/Plan:     50 y.o. G4 P74 female with abnormally thickened endometrium  Schedule endometrial biopsy with MD   [facility] 04/25/2021, 11:47 AM

## 2021-05-14 ENCOUNTER — Ambulatory Visit: Payer: No Typology Code available for payment source | Admitting: Obstetrics & Gynecology

## 2021-05-25 ENCOUNTER — Ambulatory Visit: Payer: No Typology Code available for payment source | Admitting: Obstetrics & Gynecology

## 2021-07-23 ENCOUNTER — Other Ambulatory Visit: Payer: Self-pay | Admitting: Family Medicine

## 2021-07-23 DIAGNOSIS — Z1231 Encounter for screening mammogram for malignant neoplasm of breast: Secondary | ICD-10-CM

## 2021-08-14 ENCOUNTER — Ambulatory Visit: Payer: No Typology Code available for payment source

## 2021-09-18 ENCOUNTER — Other Ambulatory Visit: Payer: Self-pay

## 2021-09-18 ENCOUNTER — Ambulatory Visit
Admission: RE | Admit: 2021-09-18 | Discharge: 2021-09-18 | Disposition: A | Discharge status: Home / Self Care | Payer: No Typology Code available for payment source | Source: Ambulatory Visit | Attending: Family Medicine | Admitting: Family Medicine

## 2021-09-18 DIAGNOSIS — Z1231 Encounter for screening mammogram for malignant neoplasm of breast: Secondary | ICD-10-CM | POA: Insufficient documentation

## 2021-10-29 IMAGING — MG DIGITAL SCREENING BILAT W/ TOMO W/ CAD
8 series · 8 of 24 positions shown · non-contrast
Comparison: Previous exam(s).

CLINICAL DATA: Screening.

EXAM:
DIGITAL SCREENING BILATERAL MAMMOGRAM WITH TOMO AND CAD

[R MLO synth-2D]
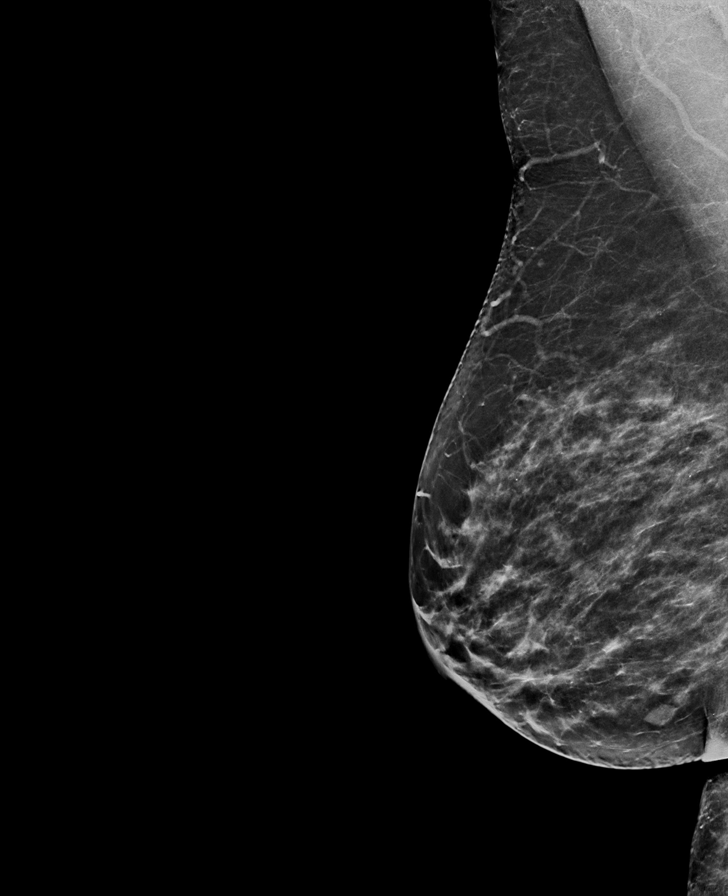

[L CC synth-2D]
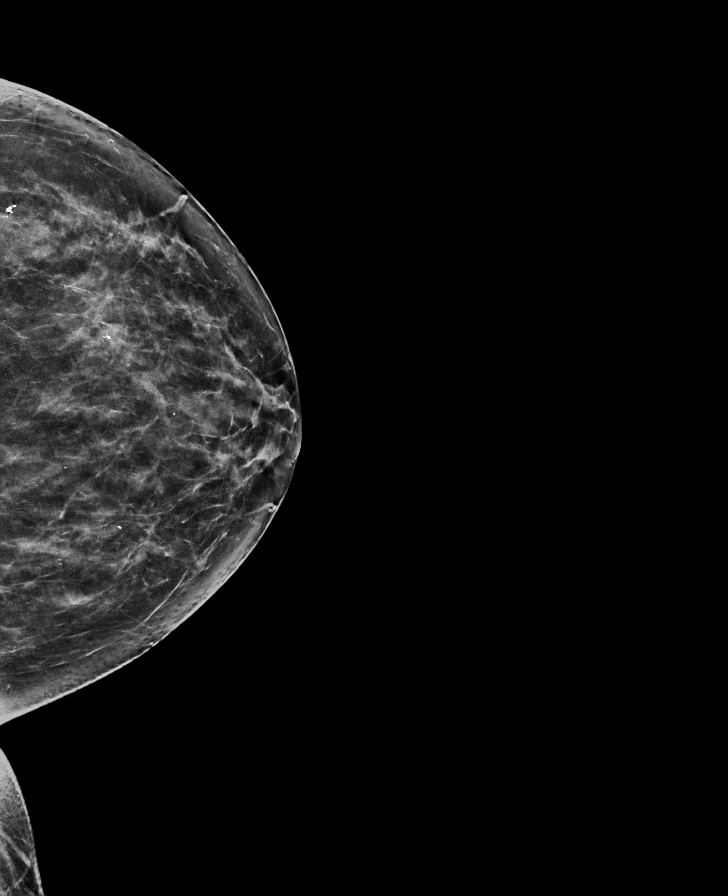

[R CC synth-2D]
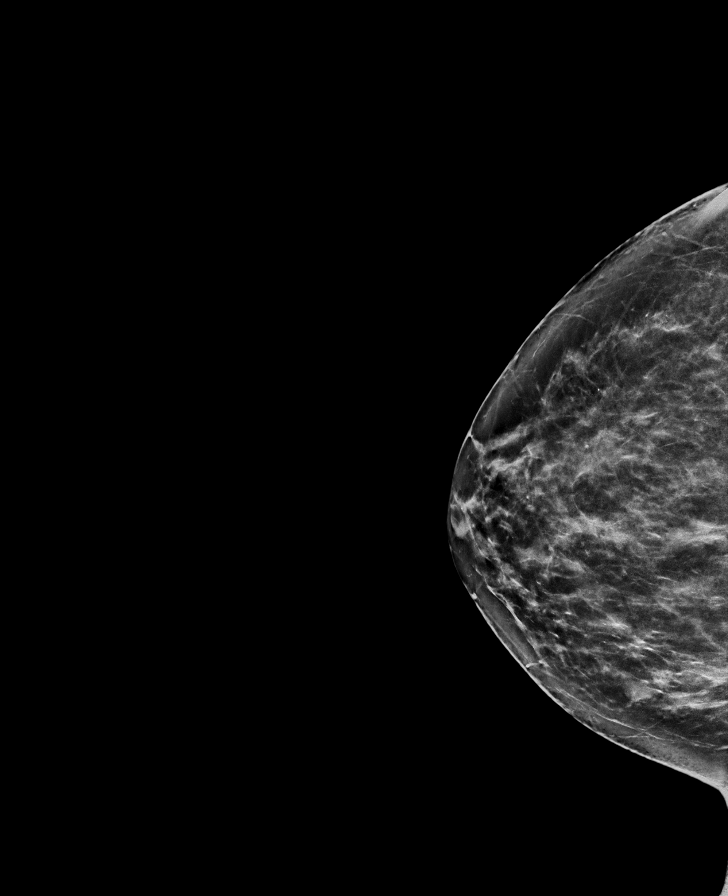

[L MLO synth-2D]
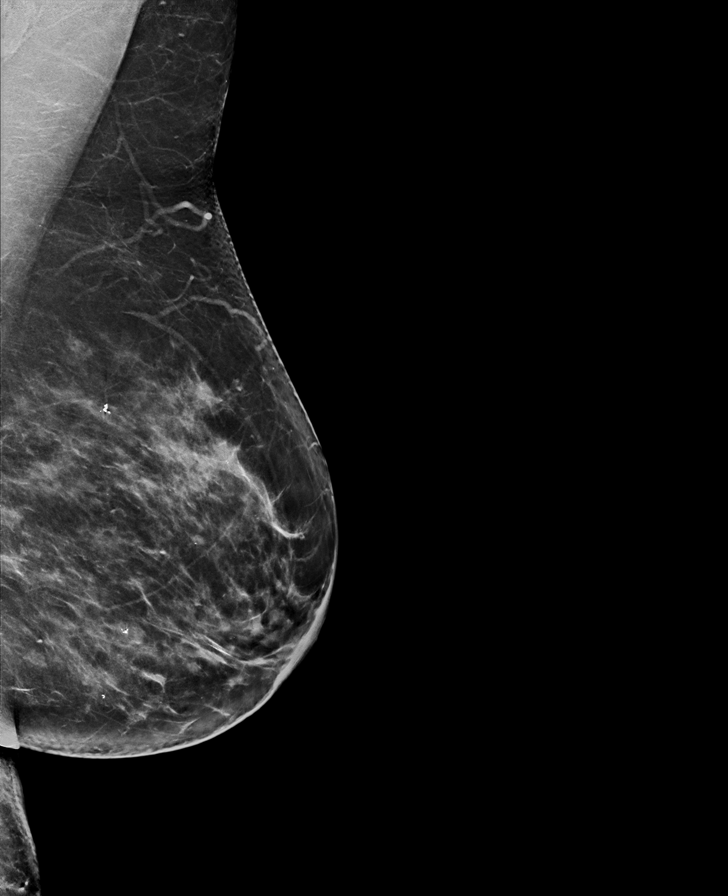

[L CC tomo · tomo slice 35/68.0]
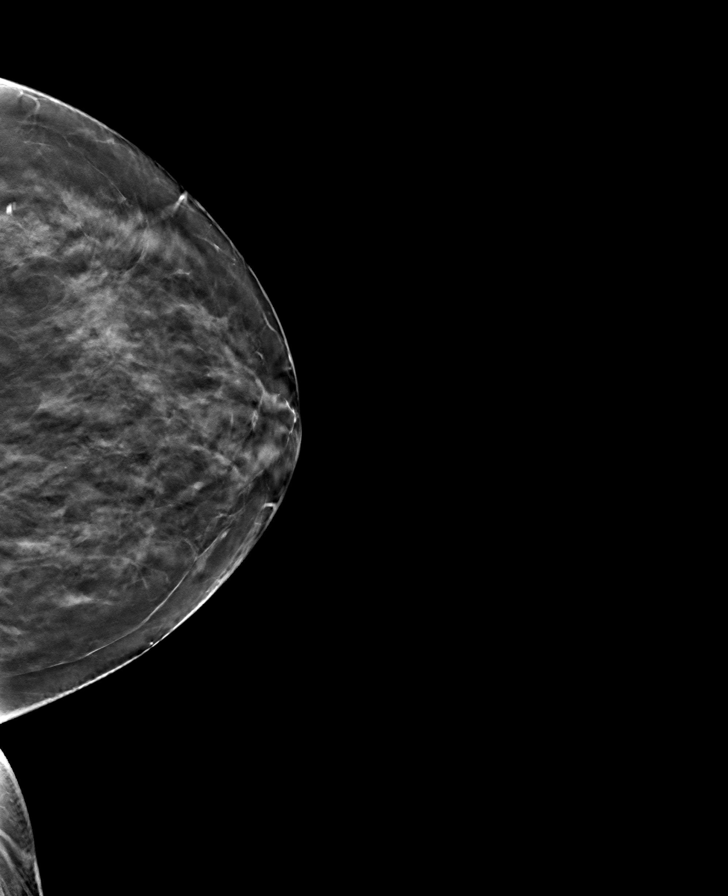

[R CC tomo · tomo slice 35/68.0]
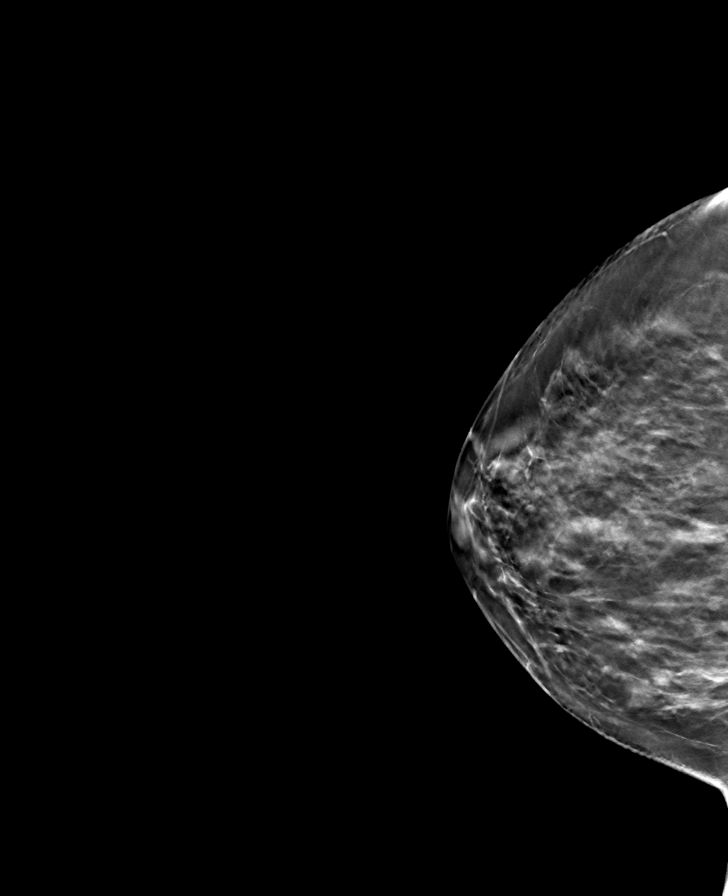

[R MLO tomo · tomo slice 37/72.0]
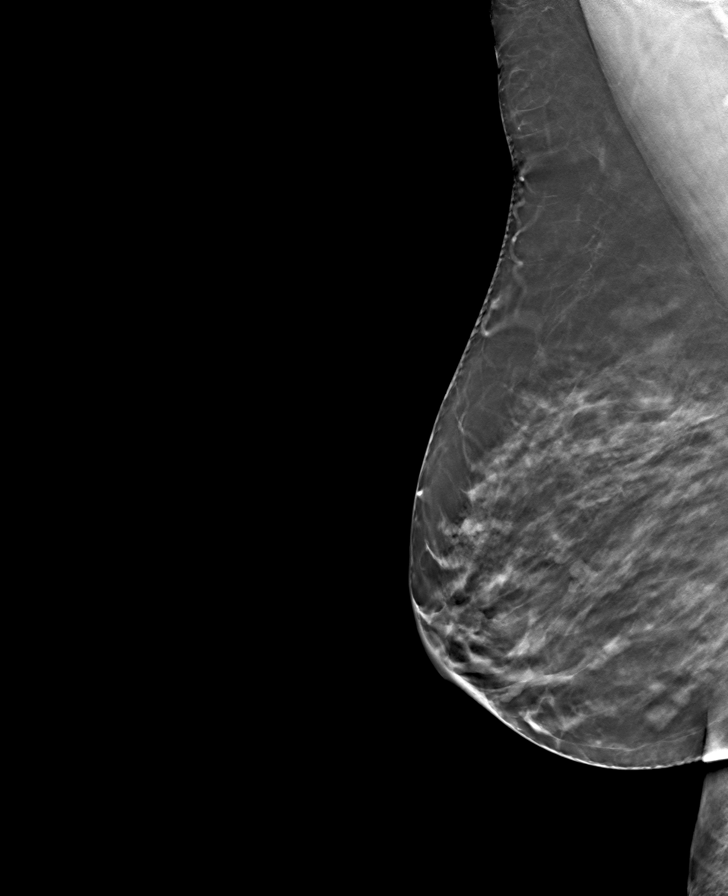

[L MLO tomo · tomo slice 39/78.0]
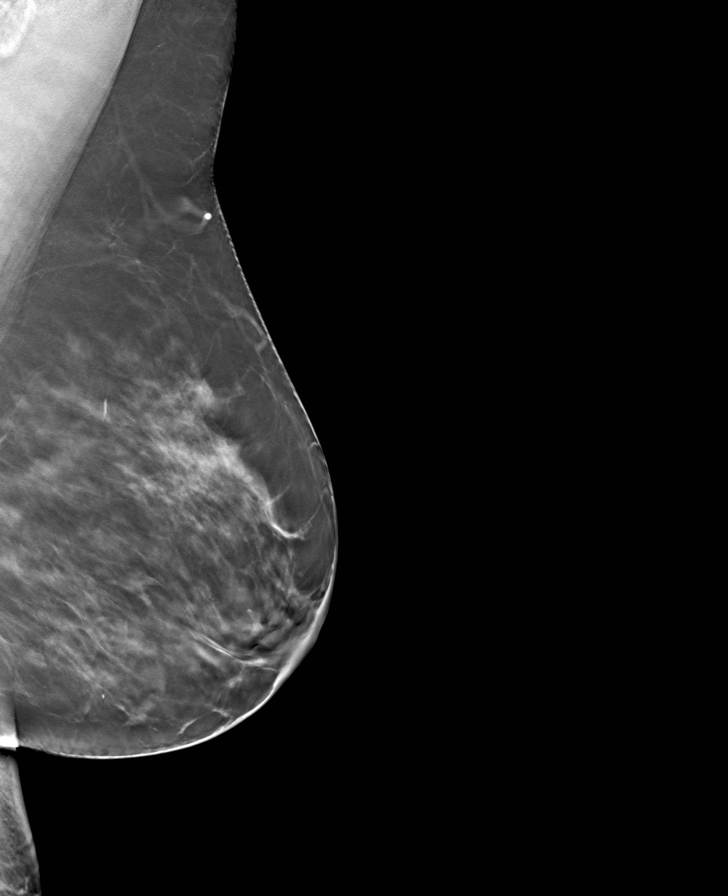

[8 of 24 positions shown; findings below may reference images not displayed]

ACR Breast Density Category c: The breast tissue is heterogeneously
dense, which may obscure small masses.
FINDINGS: There are no findings suspicious for malignancy. Images were
processed with CAD.
IMPRESSION: No mammographic evidence of malignancy. A result letter of this
screening mammogram will be mailed directly to the patient.

RECOMMENDATION:
Screening mammogram in one year. (Code:1B-4-GYL)

The [employer] recommends annual MRI and mammography in
patients with an estimated lifetime risk of developing breast cancer
greater than 20 - 25%, or who are known or suspected to be positive
for the breast cancer gene.

BI-RADS CATEGORY  1: Negative.

## 2021-12-19 ENCOUNTER — Other Ambulatory Visit
Admission: RE | Admit: 2021-12-19 | Discharge: 2021-12-19 | Disposition: A | Discharge status: Home / Self Care | Payer: 59 | Source: Ambulatory Visit | Attending: Family Medicine | Admitting: Family Medicine

## 2021-12-19 DIAGNOSIS — Z0000 Encounter for general adult medical examination without abnormal findings: Secondary | ICD-10-CM | POA: Diagnosis present

## 2021-12-19 DIAGNOSIS — R079 Chest pain, unspecified: Secondary | ICD-10-CM | POA: Diagnosis present

## 2021-12-19 LAB — D-DIMER, QUANTITATIVE: D-Dimer, Quant: 0.29 ug/mL-FEU (ref 0.00–0.50)

## 2022-10-28 ENCOUNTER — Telehealth: Payer: Self-pay

## 2022-10-28 NOTE — Telephone Encounter (Signed)
[facility] is requesting medical records for patient from 09/30/2022 to 10/23/2022. Patient has not been seen at our office during this time. Faxed this back to inform them of this information at [phone]

## 2023-08-07 ENCOUNTER — Other Ambulatory Visit: Payer: Self-pay | Admitting: Family Medicine

## 2023-08-07 ENCOUNTER — Ambulatory Visit
Admission: RE | Admit: 2023-08-07 | Discharge: 2023-08-07 | Disposition: A | Discharge status: Home / Self Care | Payer: 59 | Source: Ambulatory Visit | Attending: Family Medicine | Admitting: Family Medicine

## 2023-08-07 DIAGNOSIS — M7989 Other specified soft tissue disorders: Secondary | ICD-10-CM | POA: Insufficient documentation

## 2024-05-17 ENCOUNTER — Other Ambulatory Visit: Payer: Self-pay | Admitting: Medical Genetics

## 2024-05-27 ENCOUNTER — Other Ambulatory Visit
Admission: RE | Admit: 2024-05-27 | Discharge: 2024-05-27 | Disposition: A | Discharge status: Home / Self Care | Payer: Self-pay | Source: Ambulatory Visit | Attending: Medical Genetics | Admitting: Medical Genetics

## 2024-06-08 LAB — GENECONNECT MOLECULAR SCREEN: Genetic Analysis Overall Interpretation: NEGATIVE
